# Patient Record
Sex: Male | Born: 1945 | Race: White | Hispanic: No | Marital: Single | Smoking: Former smoker
Health system: Southern US, Community
[De-identification: ages and names within clinical notes are randomized; demographics above are authoritative.]

## PROBLEM LIST (undated history)

## (undated) DIAGNOSIS — I219 Acute myocardial infarction, unspecified: Secondary | ICD-10-CM

## (undated) DIAGNOSIS — E785 Hyperlipidemia, unspecified: Secondary | ICD-10-CM

## (undated) DIAGNOSIS — I10 Essential (primary) hypertension: Secondary | ICD-10-CM

## (undated) DIAGNOSIS — Z955 Presence of coronary angioplasty implant and graft: Secondary | ICD-10-CM

---

## 2005-06-23 DIAGNOSIS — Z955 Presence of coronary angioplasty implant and graft: Secondary | ICD-10-CM

## 2005-06-23 HISTORY — DX: Presence of coronary angioplasty implant and graft: Z95.5

## 2005-06-23 HISTORY — PX: CORONARY ANGIOPLASTY WITH STENT PLACEMENT: SHX49

## 2008-06-23 DIAGNOSIS — I219 Acute myocardial infarction, unspecified: Secondary | ICD-10-CM

## 2008-06-23 HISTORY — PX: CORONARY ANGIOPLASTY WITH STENT PLACEMENT: SHX49

## 2008-06-23 HISTORY — DX: Acute myocardial infarction, unspecified: I21.9

## 2008-08-24 ENCOUNTER — Inpatient Hospital Stay (HOSPITAL_COMMUNITY): Admission: EM | Admit: 2008-08-24 | Discharge: 2008-08-26 | Payer: Self-pay | Admitting: *Deleted

## 2008-08-24 ENCOUNTER — Ambulatory Visit: Payer: Self-pay | Admitting: Cardiology

## 2009-05-04 ENCOUNTER — Ambulatory Visit: Payer: Self-pay

## 2010-10-03 LAB — CBC
HCT: 38.2 % — ABNORMAL LOW (ref 39.0–52.0)
HCT: 39.4 % (ref 39.0–52.0)
HCT: 39.5 % (ref 39.0–52.0)
HCT: 41.1 % (ref 39.0–52.0)
Hemoglobin: 13.3 g/dL (ref 13.0–17.0)
Hemoglobin: 13.9 g/dL (ref 13.0–17.0)
Hemoglobin: 14.4 g/dL (ref 13.0–17.0)
MCHC: 34.9 g/dL (ref 30.0–36.0)
MCHC: 35.1 g/dL (ref 30.0–36.0)
MCV: 100.5 fL — ABNORMAL HIGH (ref 78.0–100.0)
MCV: 101.3 fL — ABNORMAL HIGH (ref 78.0–100.0)
MCV: 102.1 fL — ABNORMAL HIGH (ref 78.0–100.0)
MCV: 102.2 fL — ABNORMAL HIGH (ref 78.0–100.0)
Platelets: 220 10*3/uL (ref 150–400)
Platelets: 223 10*3/uL (ref 150–400)
Platelets: 239 10*3/uL (ref 150–400)
Platelets: 257 10*3/uL (ref 150–400)
RBC: 4.09 MIL/uL — ABNORMAL LOW (ref 4.22–5.81)
RDW: 13.8 % (ref 11.5–15.5)
RDW: 13.8 % (ref 11.5–15.5)
RDW: 14 % (ref 11.5–15.5)
WBC: 10.6 10*3/uL — ABNORMAL HIGH (ref 4.0–10.5)
WBC: 14.1 10*3/uL — ABNORMAL HIGH (ref 4.0–10.5)

## 2010-10-03 LAB — BASIC METABOLIC PANEL
BUN: 11 mg/dL (ref 6–23)
BUN: 8 mg/dL (ref 6–23)
CO2: 24 mEq/L (ref 19–32)
Chloride: 108 mEq/L (ref 96–112)
Chloride: 112 mEq/L (ref 96–112)
Creatinine, Ser: 0.97 mg/dL (ref 0.4–1.5)
GFR calc non Af Amer: >60 mL/min (ref >60)
Glucose, Bld: 139 mg/dL — ABNORMAL HIGH (ref 70–99)
Glucose, Bld: 150 mg/dL — ABNORMAL HIGH (ref 70–99)
Potassium: 3.7 mEq/L (ref 3.5–5.1)
Potassium: 4.2 mEq/L (ref 3.5–5.1)
Sodium: 140 mEq/L (ref 135–145)

## 2010-10-03 LAB — DIFFERENTIAL
Eosinophils Absolute: 0.1 10*3/uL (ref 0.0–0.7)
Eosinophils Relative: 1 % (ref 0–5)
Lymphs Abs: 2.2 10*3/uL (ref 0.7–4.0)
Monocytes Absolute: 0.2 10*3/uL (ref 0.1–1.0)
Monocytes Relative: 2 % — ABNORMAL LOW (ref 3–12)

## 2010-10-03 LAB — POCT I-STAT 3, ART BLOOD GAS (G3+)
Bicarbonate: 19.9 mEq/L — ABNORMAL LOW (ref 20.0–24.0)
O2 Saturation: 100 %
TCO2: 21 mmol/L (ref 0–100)
pCO2 arterial: 38.4 mmHg (ref 35.0–45.0)

## 2010-10-03 LAB — POCT I-STAT, CHEM 8
BUN: 14 mg/dL (ref 6–23)
Calcium, Ion: 1.15 mmol/L (ref 1.12–1.32)
Chloride: 108 mEq/L (ref 96–112)
Creatinine, Ser: 0.9 mg/dL (ref 0.4–1.5)
TCO2: 20 mmol/L (ref 0–100)

## 2010-10-03 LAB — MAGNESIUM: Magnesium: 2.3 mg/dL (ref 1.5–2.5)

## 2010-10-03 LAB — GLUCOSE, CAPILLARY: Glucose-Capillary: 117 mg/dL — ABNORMAL HIGH (ref 70–99)

## 2010-10-03 LAB — CK TOTAL AND CKMB (NOT AT ARMC)
Relative Index: 11.2 — ABNORMAL HIGH (ref 0.0–2.5)
Total CK: 612 U/L — ABNORMAL HIGH (ref 7–232)

## 2010-10-03 LAB — LIPID PANEL
HDL: 41 mg/dL (ref >39)
Total CHOL/HDL Ratio: 3.9 RATIO
VLDL: 20 mg/dL (ref 0–40)

## 2010-10-03 LAB — PROTIME-INR
INR: 1 (ref 0.00–1.49)
Prothrombin Time: 13.5 seconds (ref 11.6–15.2)

## 2010-10-03 LAB — TROPONIN I: Troponin I: 4.43 ng/mL (ref 0.00–0.06)

## 2010-11-05 NOTE — H&P (Signed)
NAMEMARLOW, BERENGUER NO.:  [id number]   MEDICAL RECORD NO.:  [id number]          PATIENT TYPE:  INP   LOCATION:  2913                         FACILITY:  [facility]   PHYSICIAN:  Marca Ancona, MD      DATE OF BIRTH:  [birth year]   DATE OF ADMISSION:  08/24/2008  DATE OF DISCHARGE:                              HISTORY & PHYSICAL   PRIMARY CARDIOLOGIST:  In [place].   PRIMARY PHYSICIAN:  Lianne Bushy, MD in [place].   HISTORY OF PRESENT ILLNESS:  This is a 65 year old with a history of  coronary artery disease status post PCI 4 years ago at [facility] who  developed severe substernal chest pain 2-1/2 hours ago at home while at  rest.  The pain has been 10/10 substernal pressure and associated with  diaphoresis and the pain has been unrelenting.  The patient has had no  chest pain since his prior procedure.  He has good exercise tolerance,  and no chest pain or shortness of breath with exertion at baseline.   ALLERGIES:  None.   MEDICATIONS:  1. Coreg 3.125 mg b.i.d.  2. Enalapril 5 mg daily.  3. Aspirin 81 mg daily.  4. Pravastatin 40 mg daily.   PAST MEDICAL HISTORY:  1. Coronary artery disease.  The patient did have a stent placed 4      years ago at [facility], question which vessel  this was, he has      had no chest pain since.  2. Hypertension.  3. Hyperlipidemia.   Of note, the patient has no history of diabetes and no history of GI  bleed.   SOCIAL HISTORY:  The patient lives in [place] alone.  He works at [employer].  He quit smoking a year ago.  Drinks alcohol rarely, does  not use any illicit drugs.   FAMILY HISTORY:  Noncontributory.   REVIEW OF SYSTEMS:  Negative except as noted in the history of present  illness.   EKG is reviewed, shows sinus rhythm with an inferoposterior MI.  Labs  are pending.   PHYSICAL EXAMINATION:  VITAL SIGNS:  The patient is afebrile, pulse is  in the 50s, blood pressure 140/91, oxygen saturation 100%  on 2 liters  nasal cannula.  GENERAL:  This is a well-developed male in moderate distress.  HEENT:  Normal exam.  ABDOMEN:  Soft and nontender.  No hepatosplenomegaly.  NECK:  There is no JVD.  There is no thyromegaly or thyroid nodule.  CARDIOVASCULAR:  Heart regular.  S1 and S2.  There is a soft S4.  There  is no S3.  There is no murmur.  There are 2+ posterior tibial pulses  bilaterally.  There is no carotid bruit and there is no peripheral  edema.  EXTREMITIES:  No clubbing or cyanosis, slight bibasilar crackles.  MUSCULOSKELETAL:  Normal.  NEUROLOGIC:  Alert and oriented x3.  Normal affect.   ASSESSMENT AND PLAN:  This is a 65 year old with history of coronary  artery disease who presents with an inferoposterior myocardial  infarction.  Plan will be to  give him aspirin, start heparin drips, as  well as nitroglycerin drip.  His heart rate is in the 50s.  We will hold  off on Lopressor.  He will be transported to the cath lab for primary  percutaneous coronary intervention.      Marca Ancona, MD  Electronically Signed     DM/MEDQ  D:  08/24/2008  T:  08/24/2008  Job:  [id number]   cc:   Corky Crafts, MD  Lianne Bushy, M.D.

## 2010-11-08 NOTE — Discharge Summary (Signed)
[name], [name]:  [id number]   MEDICAL RECORD NO.:  [id number]          PATIENT TYPE:  INP   LOCATION:  2036                         FACILITY:  [facility]   PHYSICIAN:  Corky Crafts, MDDATE OF BIRTH:  03-Dec-1945   DATE OF ADMISSION:  08/24/2008  DATE OF DISCHARGE:  08/26/2008                               DISCHARGE SUMMARY   DISCHARGE DIAGNOSES:  1. Acute Inferior wall myocardial infarction.  2. Ischemic cardiomyopathy, ejection fraction 45%.  3. Former smoker, smoking cessation counseling.  4. Infrarenal aneurysm.  5. Dyslipidemia.  6. Hypertension.  7. Long-term medication use.   Kevin Randolph is a 65 year old male patient who has had a percutaneous  intervention about 4 years ago at [facility].  He developed substernal  chest pain about 2-1/2 hours prior to arrival at [facility], the pain  was at rest.  It was associated with diaphoresis and has been  unrelenting.   Upon arrival to the emergency room, the patient had significant ST-T  wave changes in the inferior leads consistent with STEMI of the inferior  wall.  Apparently, the RCA was occluded proximally just at the previous  stent site.  Dr. Eldridge Dace was able to use a fetch device with successful  aspiration of thrombus, a bare-metal stent was placed.  Dr. Eldridge Dace  felt the patient needed to remain on aspirin and Plavix for at least 30  days, but likely longer.  The patient did well and was ready for  discharge to home within 2 days.   DISCHARGE LABORATORIES:  Sodium 140, potassium 3.5, BUN 8, creatinine  0.99, hemoglobin 13.3, hematocrit 38.2, platelets 223, and white count  10.3.  Maximum CK 1521 and MB fraction of 282.4, and troponin 37.73.   DISCHARGE MEDICATIONS:  1. Enalapril 5 mg a day.  2. Coreg 3.125 mg a day.  3. Coated aspirin 375 mg a day.  4. Sublingual nitroglycerin p.r.n. chest pain.  5. Plavix 75 mg a day.  6. Zocor 20 mg a day.  Again, the patient is discharged  to home in stable, but improved  condition.  He is to remain on low-sodium, heart-  healthy diet.  Clean cath site gently with soap and water.  No  scrubbing.  Increase activity slowly as per cardiac rehabilitation.  No  lifting over 10 pounds for 1 week.  No driving for 1 week.  Follow up  with Dr. Eldridge Dace on September 08, 2008, at 11:45 a.m.  In addition, we  asked him to stop taking his pravastatin and to remain on Zocor.      Guy Franco, P.A.      Corky Crafts, MD  Electronically Signed    LB/MEDQ  D:  10/12/2008  T:  10/13/2008  Job:  [id number]   cc:   Lianne Bushy, M.D.  Corky Crafts, MD

## 2014-03-15 ENCOUNTER — Ambulatory Visit: Payer: Self-pay | Admitting: Internal Medicine

## 2016-07-20 ENCOUNTER — Emergency Department (HOSPITAL_COMMUNITY): Payer: Medicare Other

## 2016-07-20 ENCOUNTER — Encounter (HOSPITAL_COMMUNITY): Payer: Self-pay | Admitting: *Deleted

## 2016-07-20 ENCOUNTER — Inpatient Hospital Stay (HOSPITAL_COMMUNITY)
Admission: EM | Admit: 2016-07-20 | Discharge: 2016-07-22 | DRG: 287 | Disposition: A | Discharge status: Home / Self Care | Payer: Medicare Other | Attending: Internal Medicine | Admitting: Internal Medicine

## 2016-07-20 DIAGNOSIS — I25110 Atherosclerotic heart disease of native coronary artery with unstable angina pectoris: Secondary | ICD-10-CM | POA: Diagnosis present

## 2016-07-20 DIAGNOSIS — Y831 Surgical operation with implant of artificial internal device as the cause of abnormal reaction of the patient, or of later complication, without mention of misadventure at the time of the procedure: Secondary | ICD-10-CM | POA: Diagnosis present

## 2016-07-20 DIAGNOSIS — R079 Chest pain, unspecified: Secondary | ICD-10-CM | POA: Diagnosis present

## 2016-07-20 DIAGNOSIS — I252 Old myocardial infarction: Secondary | ICD-10-CM

## 2016-07-20 DIAGNOSIS — I110 Hypertensive heart disease with heart failure: Secondary | ICD-10-CM | POA: Diagnosis present

## 2016-07-20 DIAGNOSIS — Z7982 Long term (current) use of aspirin: Secondary | ICD-10-CM

## 2016-07-20 DIAGNOSIS — Z79899 Other long term (current) drug therapy: Secondary | ICD-10-CM

## 2016-07-20 DIAGNOSIS — Z9119 Patient's noncompliance with other medical treatment and regimen: Secondary | ICD-10-CM

## 2016-07-20 DIAGNOSIS — I509 Heart failure, unspecified: Secondary | ICD-10-CM | POA: Diagnosis present

## 2016-07-20 DIAGNOSIS — E785 Hyperlipidemia, unspecified: Secondary | ICD-10-CM | POA: Diagnosis present

## 2016-07-20 DIAGNOSIS — T82855A Stenosis of coronary artery stent, initial encounter: Principal | ICD-10-CM | POA: Diagnosis present

## 2016-07-20 DIAGNOSIS — Z955 Presence of coronary angioplasty implant and graft: Secondary | ICD-10-CM

## 2016-07-20 DIAGNOSIS — Z87891 Personal history of nicotine dependence: Secondary | ICD-10-CM

## 2016-07-20 DIAGNOSIS — I2510 Atherosclerotic heart disease of native coronary artery without angina pectoris: Secondary | ICD-10-CM | POA: Diagnosis present

## 2016-07-20 HISTORY — DX: Presence of coronary angioplasty implant and graft: Z95.5

## 2016-07-20 HISTORY — DX: Essential (primary) hypertension: I10

## 2016-07-20 HISTORY — DX: Hyperlipidemia, unspecified: E78.5

## 2016-07-20 HISTORY — DX: Acute myocardial infarction, unspecified: I21.9

## 2016-07-20 LAB — BASIC METABOLIC PANEL
Anion gap: 8 (ref 5–15)
BUN: 11 mg/dL (ref 6–20)
CALCIUM: 9 mg/dL (ref 8.9–10.3)
CHLORIDE: 112 mmol/L — AB (ref 101–111)
CO2: 21 mmol/L — AB (ref 22–32)
CREATININE: 0.66 mg/dL (ref 0.61–1.24)
GFR calc Af Amer: >60 mL/min (ref >60)
GFR calc non Af Amer: >60 mL/min (ref >60)
GLUCOSE: 88 mg/dL (ref 65–99)
Potassium: 4 mmol/L (ref 3.5–5.1)
Sodium: 141 mmol/L (ref 135–145)

## 2016-07-20 LAB — TROPONIN I

## 2016-07-20 LAB — CBC
HCT: 44.9 % (ref 39.0–52.0)
Hemoglobin: 15.7 g/dL (ref 13.0–17.0)
MCH: 33.3 pg (ref 26.0–34.0)
MCHC: 35 g/dL (ref 30.0–36.0)
MCV: 95.3 fL (ref 78.0–100.0)
PLATELETS: 212 10*3/uL (ref 150–400)
RBC: 4.71 MIL/uL (ref 4.22–5.81)
RDW: 13.1 % (ref 11.5–15.5)
WBC: 9.8 10*3/uL (ref 4.0–10.5)

## 2016-07-20 MED ORDER — HYDRALAZINE HCL 20 MG/ML IJ SOLN
10.0000 mg | Freq: Once | INTRAMUSCULAR | Status: DC
  Filled 2016-07-20: qty 1

## 2016-07-20 MED ORDER — NITROGLYCERIN 0.4 MG SL SUBL
0.4000 mg | SUBLINGUAL_TABLET | SUBLINGUAL | Status: DC | PRN
  Administered 2016-07-20: 0.4 mg via SUBLINGUAL
  Filled 2016-07-20: qty 1

## 2016-07-20 MED ORDER — MORPHINE SULFATE (PF) 4 MG/ML IV SOLN
4.0000 mg | INTRAVENOUS | Status: DC | PRN
  Administered 2016-07-20 – 2016-07-21 (×2): 4 mg via INTRAVENOUS
  Filled 2016-07-20 (×2): qty 1

## 2016-07-20 NOTE — ED Triage Notes (Signed)
The pt is c/o chest pain  For 2 weeks no sob no nausea no dizziness he reports that when he has chest pain he takes an aspirin but today that did not help

## 2016-07-20 NOTE — ED Provider Notes (Addendum)
[facility] DE[facility] Provider Note   CSN: [id number] Arrival date & time: 07/20/16  2124     History   Chief Complaint Chief Complaint  Patient presents with  . Chest Pain    HPI Kevin Randolph is a 71 y.o. male. His chief complaint is chest pain.  HPI: She has had intermittent chest pain over the last few weeks. Is similar to past episodes of angina and previous MI. Left chest rating to his left arm to the elbow. Mild associated shortness of breath. No nausea no syncope presyncope lightheadedness or other symptoms. History of an MI with right coronary stent done at [facility] in 2009. Had recurrent MI requiring repeat PTCI with additional right coronary artery stents in 2010. He follows with a cardiologist in [place].  Pain on arrival is 3-4/10  Past Medical History:  Diagnosis Date  . Coronary artery disease   . History of placement of stent in LAD coronary artery   . MI (myocardial infarction)     Patient Active Problem List   Diagnosis Date Noted  . Chest pain 07/20/2016    History reviewed. No pertinent surgical history.     Home Medications    Prior to Admission medications   Medication Sig Start Date End Date Taking? Authorizing Provider  aspirin EC 81 MG tablet Take 81 mg by mouth daily.   Yes Historical Provider, MD  atorvastatin (LIPITOR) 40 MG tablet Take 40 mg by mouth daily.   Yes Historical Provider, MD  carvedilol (COREG) 6.25 MG tablet Take 6.25 mg by mouth 2 (two) times daily with a meal.   Yes Historical Provider, MD  lisinopril (PRINIVIL,ZESTRIL) 10 MG tablet Take 10 mg by mouth daily.   Yes Historical Provider, MD    Family History No family history on file.  Social History Social History  Substance Use Topics  . Smoking status: Current Every Day Smoker  . Smokeless tobacco: Never Used  . Alcohol use No     Allergies   Patient has no known allergies.   Review of Systems Review of Systems  Constitutional: Negative for appetite  change, chills, diaphoresis, fatigue and fever.  HENT: Negative for mouth sores, sore throat and trouble swallowing.   Eyes: Negative for visual disturbance.  Respiratory: Negative for cough, chest tightness, shortness of breath and wheezing.   Cardiovascular: Positive for chest pain.  Gastrointestinal: Negative for abdominal distention, abdominal pain, diarrhea, nausea and vomiting.  Endocrine: Negative for polydipsia, polyphagia and polyuria.  Genitourinary: Negative for dysuria, frequency and hematuria.  Musculoskeletal: Negative for gait problem.  Skin: Negative for color change, pallor and rash.  Neurological: Negative for dizziness, syncope, light-headedness and headaches.  Hematological: Does not bruise/bleed easily.  Psychiatric/Behavioral: Negative for behavioral problems and confusion.     Physical Exam Updated Vital Signs BP 125/72   Pulse (!) 47   Temp 98.6 F (37 C) (Oral)   Resp 18   Ht 5\' 9"  (1.753 m)   Wt 145 lb (65.8 kg)   SpO2 96%   BMI 21.41 kg/m   Physical Exam  Constitutional: He is oriented to person, place, and time. He appears well-developed and well-nourished. No distress.  HENT:  Head: Normocephalic.  Eyes: Conjunctivae are normal. Pupils are equal, round, and reactive to light. No scleral icterus.  Neck: Normal range of motion. Neck supple. No thyromegaly present.  Cardiovascular: Normal rate and regular rhythm.  Exam reveals no gallop and no friction rub.   No murmur heard. Pulmonary/Chest: Effort  normal and breath sounds normal. No respiratory distress. He has no wheezes. He has no rales.  Abdominal: Soft. Bowel sounds are normal. He exhibits no distension. There is no tenderness. There is no rebound.  Musculoskeletal: Normal range of motion.  Neurological: He is alert and oriented to person, place, and time.  Skin: Skin is warm and dry. No rash noted.  Psychiatric: He has a normal mood and affect. His behavior is normal.     ED Treatments /  Results  Labs (all labs ordered are listed, but only abnormal results are displayed) Labs Reviewed  BASIC METABOLIC PANEL - Abnormal; Notable for the following:       Result Value   Chloride 112 (*)    CO2 21 (*)    All other components within normal limits  CBC  TROPONIN I    EKG  EKG Interpretation  Date/Time:  Sunday July 20 2016 23:29:51 EST Ventricular Rate:  47 PR Interval:  176 QRS Duration: 115 QT Interval:  449 QTC Calculation: 397 R Axis:   47 Text Interpretation:  Sinus bradycardia Nonspecific intraventricular conduction delay Inferior infarct, age indeterminate Consider anterolateral infarct Confirmed by Fayrene FearingJAMES  MD, Brindy Higginbotham ([id number]) on 07/20/2016 11:35:06 PM       Radiology Dg Chest 2 View  Result Date: 07/20/2016 CLINICAL DATA:  Chest pain 2 weeks. EXAM: CHEST  2 VIEW COMPARISON:  10/15/2014 FINDINGS: Lungs are adequately inflated without airspace consolidation or effusion. Small nodular density projected over the left base on the frontal film. Cardiomediastinal silhouette is within normal. There is calcified plaque over the aortic arch. There are mild degenerate changes of the spine. IMPRESSION: No acute cardiopulmonary disease. Small nodular density projected over the left base on the frontal film. Recommend follow-up chest radiograph 3 months. If there is persist, would recommend follow-up noncontrast chest CT. Electronically Signed   By: Elberta Fortisaniel  Boyle M.D.   On: 07/20/2016 23:00    Procedures Procedures (including critical care time)  Medications Ordered in ED Medications  morphine 4 MG/ML injection 4 mg (4 mg Intravenous Given 07/20/16 2212)  nitroGLYCERIN (NITROSTAT) SL tablet 0.4 mg (0.4 mg Sublingual Given 07/20/16 2205)  hydrALAZINE (APRESOLINE) injection 10 mg (0 mg Intravenous Hold 07/20/16 2204)     Initial Impression / Assessment and Plan / ED Course  I have reviewed the triage vital signs and the nursing notes.  Pertinent labs & imaging results  that were available during my care of the patient were reviewed by me and considered in my medical decision making (see chart for details).     KG shows inferior Q waves. No additional changes versus comparison. Given single nitroglycerin. He is hypertensive. Given hydralazine 10 as his  Heart rate was in the 50s. Recheck shows systolic BP 1:30. Heart rate 49. Symptom-free. Has had aspirin today. [facility]  The patient admitted for rule out.  Final Clinical Impressions(s) / ED Diagnoses   Final diagnoses:  Chest pain, unspecified type    New Prescriptions New Prescriptions   No medications on file     Rolland PorterMark Tyliek Timberman, MD 07/20/16 2346    Rolland PorterMark Davon Folta, MD 08/03/16 2329

## 2016-07-20 NOTE — ED Notes (Signed)
EDP at bedside  

## 2016-07-20 NOTE — ED Notes (Signed)
Pt back in room. No distress observed. 

## 2016-07-20 NOTE — ED Notes (Signed)
Patient transported to X-ray 

## 2016-07-21 ENCOUNTER — Encounter (HOSPITAL_COMMUNITY)
Admission: EM | Disposition: A | Discharge status: Home / Self Care | Payer: Self-pay | Source: Home / Self Care | Attending: Internal Medicine

## 2016-07-21 ENCOUNTER — Encounter (HOSPITAL_COMMUNITY): Payer: Self-pay | Admitting: Physician Assistant

## 2016-07-21 DIAGNOSIS — Z9119 Patient's noncompliance with other medical treatment and regimen: Secondary | ICD-10-CM | POA: Diagnosis not present

## 2016-07-21 DIAGNOSIS — E782 Mixed hyperlipidemia: Secondary | ICD-10-CM

## 2016-07-21 DIAGNOSIS — I200 Unstable angina: Secondary | ICD-10-CM

## 2016-07-21 DIAGNOSIS — E785 Hyperlipidemia, unspecified: Secondary | ICD-10-CM | POA: Diagnosis present

## 2016-07-21 DIAGNOSIS — I25110 Atherosclerotic heart disease of native coronary artery with unstable angina pectoris: Secondary | ICD-10-CM

## 2016-07-21 DIAGNOSIS — I2589 Other forms of chronic ischemic heart disease: Secondary | ICD-10-CM | POA: Diagnosis not present

## 2016-07-21 DIAGNOSIS — Z87891 Personal history of nicotine dependence: Secondary | ICD-10-CM | POA: Diagnosis not present

## 2016-07-21 DIAGNOSIS — Z955 Presence of coronary angioplasty implant and graft: Secondary | ICD-10-CM | POA: Diagnosis not present

## 2016-07-21 DIAGNOSIS — I952 Hypotension due to drugs: Secondary | ICD-10-CM

## 2016-07-21 DIAGNOSIS — R079 Chest pain, unspecified: Secondary | ICD-10-CM

## 2016-07-21 DIAGNOSIS — I110 Hypertensive heart disease with heart failure: Secondary | ICD-10-CM | POA: Diagnosis present

## 2016-07-21 DIAGNOSIS — T82855A Stenosis of coronary artery stent, initial encounter: Secondary | ICD-10-CM | POA: Diagnosis present

## 2016-07-21 DIAGNOSIS — Z79899 Other long term (current) drug therapy: Secondary | ICD-10-CM | POA: Diagnosis not present

## 2016-07-21 DIAGNOSIS — I2510 Atherosclerotic heart disease of native coronary artery without angina pectoris: Secondary | ICD-10-CM | POA: Diagnosis present

## 2016-07-21 DIAGNOSIS — Y831 Surgical operation with implant of artificial internal device as the cause of abnormal reaction of the patient, or of later complication, without mention of misadventure at the time of the procedure: Secondary | ICD-10-CM | POA: Diagnosis present

## 2016-07-21 DIAGNOSIS — I509 Heart failure, unspecified: Secondary | ICD-10-CM | POA: Diagnosis present

## 2016-07-21 DIAGNOSIS — I252 Old myocardial infarction: Secondary | ICD-10-CM | POA: Diagnosis not present

## 2016-07-21 DIAGNOSIS — Z7982 Long term (current) use of aspirin: Secondary | ICD-10-CM | POA: Diagnosis not present

## 2016-07-21 HISTORY — PX: CARDIAC CATHETERIZATION: SHX172

## 2016-07-21 LAB — PROTIME-INR
INR: 1.08
Prothrombin Time: 14.1 seconds (ref 11.4–15.2)

## 2016-07-21 LAB — TROPONIN I: Troponin I: <0.03 ng/mL (ref <0.03)

## 2016-07-21 SURGERY — LEFT HEART CATH AND CORONARY ANGIOGRAPHY

## 2016-07-21 MED ORDER — HEPARIN SODIUM (PORCINE) 1000 UNIT/ML IJ SOLN
INTRAMUSCULAR | Status: DC | PRN
  Administered 2016-07-21: 3500 [IU] via INTRAVENOUS

## 2016-07-21 MED ORDER — IOPAMIDOL (ISOVUE-370) INJECTION 76%
INTRAVENOUS | Status: DC | PRN
  Administered 2016-07-21: 45 mL via INTRA_ARTERIAL

## 2016-07-21 MED ORDER — ENOXAPARIN SODIUM 40 MG/0.4ML ~~LOC~~ SOLN
40.0000 mg | SUBCUTANEOUS | Status: DC
  Administered 2016-07-21: 40 mg via SUBCUTANEOUS
  Filled 2016-07-21: qty 0.4

## 2016-07-21 MED ORDER — SODIUM CHLORIDE 0.9% FLUSH: 3.0000 mL | INTRAVENOUS | Status: DC | PRN

## 2016-07-21 MED ORDER — FENTANYL CITRATE (PF) 100 MCG/2ML IJ SOLN
INTRAMUSCULAR | Status: DC | PRN
  Administered 2016-07-21: 50 ug via INTRAVENOUS

## 2016-07-21 MED ORDER — HEPARIN SODIUM (PORCINE) 1000 UNIT/ML IJ SOLN
INTRAMUSCULAR | Status: AC
  Filled 2016-07-21: qty 1

## 2016-07-21 MED ORDER — IOPAMIDOL (ISOVUE-370) INJECTION 76%
INTRAVENOUS | Status: AC
  Filled 2016-07-21: qty 100

## 2016-07-21 MED ORDER — ISOSORBIDE MONONITRATE ER 30 MG PO TB24
30.0000 mg | ORAL_TABLET | Freq: Every day | ORAL | Status: DC
  Administered 2016-07-21 – 2016-07-22 (×2): 30 mg via ORAL
  Filled 2016-07-21 (×2): qty 1

## 2016-07-21 MED ORDER — HEPARIN (PORCINE) IN NACL 100-0.45 UNIT/ML-% IJ SOLN
800.0000 [IU]/h | INTRAMUSCULAR | Status: DC
  Administered 2016-07-21: 800 [IU]/h via INTRAVENOUS
  Filled 2016-07-21: qty 250

## 2016-07-21 MED ORDER — CARVEDILOL 6.25 MG PO TABS
6.2500 mg | ORAL_TABLET | Freq: Two times a day (BID) | ORAL | Status: DC
  Administered 2016-07-21 – 2016-07-22 (×2): 6.25 mg via ORAL
  Filled 2016-07-21 (×3): qty 1

## 2016-07-21 MED ORDER — ACETAMINOPHEN 325 MG PO TABS: 650.0000 mg | ORAL_TABLET | ORAL | Status: DC | PRN

## 2016-07-21 MED ORDER — HEPARIN BOLUS VIA INFUSION
3000.0000 [IU] | Freq: Once | INTRAVENOUS | Status: AC
  Administered 2016-07-21: 3000 [IU] via INTRAVENOUS
  Filled 2016-07-21: qty 3000

## 2016-07-21 MED ORDER — VERAPAMIL HCL 2.5 MG/ML IV SOLN
INTRAVENOUS | Status: DC | PRN
  Administered 2016-07-21: 10 mL via INTRA_ARTERIAL

## 2016-07-21 MED ORDER — MIDAZOLAM HCL 2 MG/2ML IJ SOLN
INTRAMUSCULAR | Status: AC
  Filled 2016-07-21: qty 2

## 2016-07-21 MED ORDER — ATORVASTATIN CALCIUM 40 MG PO TABS
40.0000 mg | ORAL_TABLET | Freq: Every day | ORAL | Status: DC
  Administered 2016-07-21: 40 mg via ORAL
  Filled 2016-07-21: qty 1

## 2016-07-21 MED ORDER — HEPARIN (PORCINE) IN NACL 2-0.9 UNIT/ML-% IJ SOLN
INTRAMUSCULAR | Status: DC | PRN
  Administered 2016-07-21: 1000 mL

## 2016-07-21 MED ORDER — MIDAZOLAM HCL 2 MG/2ML IJ SOLN
INTRAMUSCULAR | Status: DC | PRN
  Administered 2016-07-21: 2 mg via INTRAVENOUS

## 2016-07-21 MED ORDER — LIDOCAINE HCL (PF) 1 % IJ SOLN
INTRAMUSCULAR | Status: DC | PRN
  Administered 2016-07-21: 1 mL via INTRADERMAL

## 2016-07-21 MED ORDER — HEPARIN (PORCINE) IN NACL 2-0.9 UNIT/ML-% IJ SOLN
INTRAMUSCULAR | Status: AC
  Filled 2016-07-21: qty 1000

## 2016-07-21 MED ORDER — VERAPAMIL HCL 2.5 MG/ML IV SOLN
INTRAVENOUS | Status: AC
  Filled 2016-07-21: qty 2

## 2016-07-21 MED ORDER — SODIUM CHLORIDE 0.9 % WEIGHT BASED INFUSION: 1.0000 mL/kg/h | INTRAVENOUS | Status: DC

## 2016-07-21 MED ORDER — SODIUM CHLORIDE 0.9 % IV SOLN
INTRAVENOUS | Status: AC
  Administered 2016-07-21: 19:00:00 via INTRAVENOUS

## 2016-07-21 MED ORDER — LISINOPRIL 10 MG PO TABS
10.0000 mg | ORAL_TABLET | Freq: Every day | ORAL | Status: DC
  Administered 2016-07-22: 10 mg via ORAL
  Filled 2016-07-21 (×2): qty 1

## 2016-07-21 MED ORDER — FENTANYL CITRATE (PF) 100 MCG/2ML IJ SOLN
INTRAMUSCULAR | Status: AC
  Filled 2016-07-21: qty 2

## 2016-07-21 MED ORDER — ENOXAPARIN SODIUM 40 MG/0.4ML ~~LOC~~ SOLN
40.0000 mg | SUBCUTANEOUS | Status: DC
  Filled 2016-07-21: qty 0.4

## 2016-07-21 MED ORDER — SODIUM CHLORIDE 0.9% FLUSH
3.0000 mL | Freq: Two times a day (BID) | INTRAVENOUS | Status: DC
  Administered 2016-07-22: 3 mL via INTRAVENOUS

## 2016-07-21 MED ORDER — ONDANSETRON HCL 4 MG/2ML IJ SOLN
4.0000 mg | Freq: Four times a day (QID) | INTRAMUSCULAR | Status: DC | PRN
Start: 2016-07-21 — End: 2016-07-22

## 2016-07-21 MED ORDER — SODIUM CHLORIDE 0.9 % IV SOLN: 250.0000 mL | INTRAVENOUS | Status: DC | PRN

## 2016-07-21 MED ORDER — ASPIRIN EC 81 MG PO TBEC
81.0000 mg | DELAYED_RELEASE_TABLET | Freq: Every day | ORAL | Status: DC
  Administered 2016-07-21 – 2016-07-22 (×2): 81 mg via ORAL
  Filled 2016-07-21 (×2): qty 1

## 2016-07-21 MED ORDER — ASPIRIN 81 MG PO CHEW: 81.0000 mg | CHEWABLE_TABLET | ORAL | Status: DC

## 2016-07-21 MED ORDER — SODIUM CHLORIDE 0.9% FLUSH: 3.0000 mL | Freq: Two times a day (BID) | INTRAVENOUS | Status: DC

## 2016-07-21 MED ORDER — LIDOCAINE HCL (PF) 1 % IJ SOLN
INTRAMUSCULAR | Status: AC
Start: 2016-07-21 — End: 2016-07-21
  Filled 2016-07-21: qty 30

## 2016-07-21 MED ORDER — SODIUM CHLORIDE 0.9 % WEIGHT BASED INFUSION: 3.0000 mL/kg/h | INTRAVENOUS | Status: DC

## 2016-07-21 SURGICAL SUPPLY — 9 items
CATH IMPULSE 5F ANG/FL3.5 (CATHETERS) ×3 IMPLANT
DEVICE RAD COMP TR BAND LRG (VASCULAR PRODUCTS) ×3 IMPLANT
GLIDESHEATH SLEND SS 6F .021 (SHEATH) ×3 IMPLANT
GUIDEWIRE INQWIRE 1.5J.035X260 (WIRE) ×1 IMPLANT
INQWIRE 1.5J .035X260CM (WIRE) ×3
KIT HEART LEFT (KITS) ×3 IMPLANT
PACK CARDIAC CATHETERIZATION (CUSTOM PROCEDURE TRAY) ×3 IMPLANT
TRANSDUCER W/STOPCOCK (MISCELLANEOUS) ×3 IMPLANT
TUBING CIL FLEX 10 FLL-RA (TUBING) ×3 IMPLANT

## 2016-07-21 NOTE — H&P (View-Only) (Signed)
CARDIOLOGY CONSULT NOTE   Patient ID: Kevin Randolph MRN: [id number] DOB/AGE: 1946-05-17 71 y.o.  Admit date: 07/20/2016  Primary Physician   No PCP Per Patient Primary Cardiologist   Dr Lucianne Muss [facility]) 07/15/2016 Reason for Consultation   Chest pain Requesting MD: Dr Benjamine Mola  YQM:VHQIO Kevin Randolph is a 71 y.o. year old male with a history of RCA stent x 2, S-CHF w/ EF 40-45%, HTN, HLD, non-compliance  01/23 office visit, CHF was at baseline, wt 145 lbs. Pt only taking ASA, advised to restart Coreg, lisinopril and Lipitor. Lasix recently increased  Since office visit, he was doing well. He developed chest pain 2 weeks ago, but would take ASA and it would get better.   01/27, he developed chest pain and L arm pain, his usual angina. He took ASA without relief. The pain reached a 7/10. He was not SOB, nauseated or sweaty. He did not have SL NTG. He came to the ER 01/28 pm and was given SL NTG x 1, and morphine. His pain was relieved.  It is currently a 0/10.   He has not been checking his weight, but does not believe he has gained any. He states he is compliant w/ rx since last office visit and rx was refilled. He quit tobacco about a week into January. No other problems or complaints.    Past Medical History:  Diagnosis Date  . HTN (hypertension)   . Hyperlipidemia LDL goal <70   . MI (myocardial infarction) 2010   ISR at prox RCA stent site, s/p BMS RCA  . Stented coronary artery 2007   RCA stent at [facility]     Past Surgical History:  Procedure Laterality Date  . CORONARY ANGIOPLASTY WITH STENT PLACEMENT  2007   RCA stent at [facility]  . CORONARY ANGIOPLASTY WITH STENT PLACEMENT  2010   BMS RCA at [facility] for ISR    No Known Allergies  I have reviewed the patient's current medications . aspirin EC  81 mg Oral Daily  . atorvastatin  40 mg Oral q1800  . carvedilol  6.25 mg Oral BID WC  . enoxaparin (LOVENOX) injection  40 mg Subcutaneous Q24H  .  hydrALAZINE  10 mg Intravenous Once  . lisinopril  10 mg Oral Daily    acetaminophen, morphine injection, nitroGLYCERIN, ondansetron (ZOFRAN) IV  Prior to Admission medications   Medication Sig Start Date End Date Taking? Authorizing Provider  aspirin EC 81 MG tablet Take 81 mg by mouth daily.   Yes Historical Provider, MD  atorvastatin (LIPITOR) 40 MG tablet Take 40 mg by mouth daily.   Yes Historical Provider, MD  carvedilol (COREG) 6.25 MG tablet Take 6.25 mg by mouth 2 (two) times daily with a meal.   Yes Historical Provider, MD  lisinopril (PRINIVIL,ZESTRIL) 10 MG tablet Take 10 mg by mouth daily.   Yes Historical Provider, MD     Social History   Social History  . Marital status: Single    Spouse name: N/A  . Number of children: N/A  . Years of education: N/A   Occupational History  . Retired    Social History Main Topics  . Smoking status: Former Smoker    Quit date: 06/30/2016  . Smokeless tobacco: Never Used  . Alcohol use No  . Drug use: Unknown  . Sexual activity: Not on file   Other Topics Concern  . Not on file   Social History Narrative  [place] in [place]    Family Status  Relation Status  . Mother Deceased  . Father Deceased  . Sister Alive  . Neg Hx    Family History  Problem Relation Age of Onset  . CAD Neg Hx      ROS:  Full 14 point review of systems complete and found to be negative unless listed above.  Physical Exam: Blood pressure 109/62, pulse (!) 46, temperature 98.2 F (36.8 C), temperature source Oral, resp. rate 16, height 5\' 9"  (1.753 m), weight 145 lb 4.8 oz (65.9 kg), SpO2 98 %.  General: Well developed, well nourished, male in no acute distress Head: Eyes PERRLA, No xanthomas.   Normocephalic and atraumatic, oropharynx without edema or exudate. Dentition: poor Lungs: few scattered rales Heart: HRRR S1 S2, no rub/gallop, no murmur. pulses are 2+ all 4 extrem.   Neck: No carotid bruits. No lymphadenopathy.  JVD not  elevated Abdomen: Bowel sounds present, abdomen soft and non-tender without masses or hernias noted. Msk:  No spine or cva tenderness. No weakness, no joint deformities or effusions. Extremities: No clubbing or cyanosis. No edema.  Neuro: Alert and oriented X 3. No focal deficits noted. Psych:  Good affect, responds appropriately Skin: No rashes or lesions noted.  Labs:   Lab Results  Component Value Date   WBC 9.8 07/20/2016   HGB 15.7 07/20/2016   HCT 44.9 07/20/2016   MCV 95.3 07/20/2016   PLT 212 07/20/2016     Recent Labs Lab 07/20/16 2144  NA 141  K 4.0  CL 112*  CO2 21*  BUN 11  CREATININE 0.66  CALCIUM 9.0  GLUCOSE 88    Recent Labs  07/20/16 2144 07/21/16 0029 07/21/16 0347  TROPONINI <0.03 <0.03 <0.03   Lab Results  Component Value Date   CHOL  08/24/2008    158        ATP III CLASSIFICATION:  <200     mg/dL   Desirable  [id number]  mg/dL   Borderline High  >=295>=240    mg/dL   High          HDL 41 08/24/2008   LDLCALC  08/24/2008    97        Total Cholesterol/HDL:CHD Risk Coronary Heart Disease Risk Table                     Men   Women  1/2 Average Risk   3.4   3.3  Average Risk       5.0   4.4  2 X Average Risk   9.6   7.1  3 X Average Risk  23.4   11.0        Use the calculated Patient Ratio above and the CHD Risk Table to determine the patient's CHD Risk.        ATP III CLASSIFICATION (LDL):  <100     mg/dL   Optimal  [id number]  mg/dL   Near or Above                    Optimal  130-159  mg/dL   Borderline  [id number]  mg/dL   High  >725>190     mg/dL   Very High   TRIG [id number] 08/24/2008   [facility]  Clinical Diagnosesand EchocardiographicFindings Left ventricularhypertrophy Contractileleft ventriculardysfunction(moderate) Segmentalcontractile leftventricular dysfunction(see detailbelow) Inferiormyocardial infarction Decreasedleft ventricularejection  fraction(40-45%) Diastolicleft ventriculardysfunction Degenerativemitral valvedisease Dilatedleft atrium Aorticsclerosis Normalright ventricularcontractile performance Descriptive  Comments- Left Ventricle The leftventricle is normalin size withmildly increasedwall thickness;there is inferiorand apicalwall akinesis aswell as ventriculardyssynchronywith otherwisemoderately decreased contraction. The visuallyestimatedleft ventricularejection fractionis 40-45%. Diastolictransmitral flowprofile and mitralannular tissueDoppler signalcharacteristic ofimpaired left ventricularrelaxation. Mitral Valve The mitralvalve is mildlythickened withequivocal leafletprolapse. Thereis trivial mitralregurgitation bycolor flow andcontinuous wave Dopplerexaminations. Left Atrium The leftatrium is mildlydilated. Aortic Valve Suboptimallyimaged aorticvalve is mildlythickened andprobably trileafletwith well-preservedexcursion. Thereis no echocardiographicevidence ofaortic regurgitationby colorflow or continuouswave Doppler examinations. Thereis no evidence ofhemodynamicallyimportant aortictransvalvular gradient;the maximal instantaneousleftventricular outflowvelocity is 1.41m/s. Aorta The thoracicaorta is normalin diameterat the level ofthe left ventricularoutflow tract,sinuses of Valsalva,sinotubularjunction and transversearch. Pulmonary Artery The pulmonaryartery issuboptimally imaged. Pulmonic Valve The pulmonaryvalve issuboptimally imaged. Thereis no detectablepulmonary regurgitationby colorflow Dopplerimaging. The Dopplersignal is ofsuboptimal technicalquality; theright ventricularoutflow tractgradient  cannotbe precisely estimatedfrom this examinationbut peakjet velocity isprobably in therange of 0.62m/s. Right Ventricle Thereis normal right ventricularchambersize, wall thicknessand contraction. Tricuspid Valve The tricuspidvalve isstructurally normal. Thereis trivial tricuspidregurgitationby color flow andcontinuous wave Dopplerimaging. The signalis technicallysuboptimal andright ventricularsystolic pressurecannot be preciselyestimated fromthis examinationbut peak jet velocityis probablyin the range of2.0 m/s consistentwith normalrange(20-25 mmHg). Right Atrium The rightatrium is normalin size. Inferior Vena Cava The inferiorvena cavais normal in sizewith physiologicalphasic respiratorychange characteristicof normalcentral venousand right atrialpressures; the estimatedcentralvenous and rightatrial pressuresare 5-10 mm Hg. Pericardium Thereis no evidence ofpericardial effusion.  ECG:  01/28 SR, inferior and far lateral T wave abnormalities, no old  Cath: 08/24/2008 RCA was occluded proximally just at the previous  stent site.  Dr. Eldridge Dace was able to use a fetch device with successful  aspiration of thrombus, a bare-metal stent was placed.  Dr. Eldridge Dace  felt the patient needed to remain on aspirin and Plavix for at least 30  days, but likely longer.  The patient did well   Radiology:  Dg Chest 2 View  Result Date: 07/20/2016 CLINICAL DATA:  Chest pain 2 weeks. EXAM: CHEST  2 VIEW COMPARISON:  10/15/2014 FINDINGS: Lungs are adequately inflated without airspace consolidation or effusion. Small nodular density projected over the left base on the frontal film. Cardiomediastinal silhouette is within normal. There is calcified plaque over the aortic arch. There are mild degenerate changes of the spine. IMPRESSION: No acute  cardiopulmonary disease. Small nodular density projected over the left base on the frontal film. Recommend follow-up chest radiograph 3 months. If there is persist, would recommend follow-up noncontrast chest CT. Electronically Signed   By: Elberta Fortis M.D.   On: 07/20/2016 23:00    ASSESSMENT AND PLAN:   The patient was seen today by Dr Delton See, the patient evaluated and the data reviewed.  Principal Problem:   Chest pain with high risk for cardiac etiology - ez are negative for MI. - His pain resolved w/ SL NTG and morphine - his ECG is abnormal, chronicity unclear. - he is NPO, MD advise on cath vs MV. Leaning toward cath because of history of CRFs being poorly controlled. - no ischemic assessment since last cath.  Active Problems:   CAD (coronary artery disease), native coronary artery - see above  Signed: Leanna Battles 07/21/2016 7:53 AM Beeper [phone]  Co-Sign MD  The patient was seen, examined and discussed with Theodore Demark, PA-C and I agree with the above.    71 year old male with h/o known CAD, s/p PCI/stents to RCA x 2 (2007, 2010), no compliant with meds, now experiencing unstable angina with progressively worsening DOE and chest pain radiating to his left arm x 2 weeks. Negative troponin x3, ECG shows significant negative T  waves in the inferolateral leads. Chest pain improved with ASA , NTG and morphine. We will schedule for a left cardiac cath, Crea 0.66.   Tobias Alexander, MD 07/21/2016

## 2016-07-21 NOTE — Interval H&P Note (Signed)
History and Physical Interval Note:  07/21/2016 3:49 PM  Kevin Randolph  has presented today for cardiac catheterization, with the diagnosis of unstable angina. The various methods of treatment have been discussed with the patient and family. After consideration of risks, benefits and other options for treatment, the patient has consented to  Procedure(s): Left Heart Cath and Coronary Angiography (N/A) as a surgical intervention .  The patient's history has been reviewed, patient examined, no change in status, stable for surgery.  I have reviewed the patient's chart and labs.  Questions were answered to the patient's satisfaction.    Cath Lab Visit (complete for each Cath Lab visit)  Clinical Evaluation Leading to the Procedure:   ACS: Yes.    Non-ACS:  N/A   Evon Lopezperez

## 2016-07-21 NOTE — Progress Notes (Signed)
ANTICOAGULATION CONSULT NOTE - Initial Consult  Pharmacy Consult for Heparin Indication: chest pain/ACS  No Known Allergies  Patient Measurements: Height: 5\' 9"  (175.3 cm) Weight: 145 lb 4.8 oz (65.9 kg) IBW/kg (Calculated) : 70.7 Heparin Dosing Weight: 65.9 kg  Vital Signs: Temp: 98.2 F (36.8 C) (01/29 0738) Temp Source: Oral (01/29 0738) BP: 109/62 (01/29 0500) Pulse Rate: 46 (01/29 0738)  Labs:  Recent Labs  07/20/16 2144 07/21/16 0029 07/21/16 0347 07/21/16 0639  HGB 15.7  --   --   --   HCT 44.9  --   --   --   PLT 212  --   --   --   CREATININE 0.66  --   --   --   TROPONINI <0.03 <0.03 <0.03 <0.03    Estimated Creatinine Clearance: 82.4 mL/min (by C-G formula based on SCr of 0.66 mg/dL).   Medical History: Past Medical History:  Diagnosis Date  . HTN (hypertension)   . Hyperlipidemia LDL goal <70   . MI (myocardial infarction) 2010   ISR at prox RCA stent site, s/p BMS RCA  . Stented coronary artery 2007   [facility]   Assessment: 71 year old male with prior CAD and stents now with chest pain to start IV heparin for plan for CATH.   CBC wnl. Troponins negative. SCr 0.66/est CrCl ~7380mL/mn. Last dose of Lovenox this AM at 0911 AM.  Goal of Therapy:  Heparin level 0.3-0.7 units/ml Monitor platelets by anticoagulation protocol: Yes   Plan:  Give 3000 units bolus x 1 Start heparin infusion at 800 units/hr Check anti-Xa level in 6 hours and daily while on heparin Continue to monitor H&H and platelets  Follow-up post CATH  Link SnufferJessica Deriona Altemose, PharmD, BCPS Clinical Pharmacist Clinical phone 07/21/2016 until 3:30PM - [phone] After hours, please call #[phone] 07/21/2016,9:34 AM

## 2016-07-21 NOTE — Consult Note (Signed)
CARDIOLOGY CONSULT NOTE   Patient ID: Kevin Randolph MRN: [id number] DOB/AGE: 1946-05-17 71 y.o.  Admit date: 07/20/2016  Primary Physician   No PCP Per Patient Primary Cardiologist   Dr Lucianne Muss [facility]) 07/15/2016 Reason for Consultation   Chest pain Requesting MD: Dr Benjamine Mola  Kevin Randolph is a 71 y.o. year old male with a history of RCA stent x 2, S-CHF w/ EF 40-45%, HTN, HLD, non-compliance  01/23 office visit, CHF was at baseline, wt 145 lbs. Pt only taking ASA, advised to restart Coreg, lisinopril and Lipitor. Lasix recently increased  Since office visit, he was doing well. He developed chest pain 2 weeks ago, but would take ASA and it would get better.   01/27, he developed chest pain and L arm pain, his usual angina. He took ASA without relief. The pain reached a 7/10. He was not SOB, nauseated or sweaty. He did not have SL NTG. He came to the ER 01/28 pm and was given SL NTG x 1, and morphine. His pain was relieved.  It is currently a 0/10.   He has not been checking his weight, but does not believe he has gained any. He states he is compliant w/ rx since last office visit and rx was refilled. He quit tobacco about a week into January. No other problems or complaints.    Past Medical History:  Diagnosis Date  . HTN (hypertension)   . Hyperlipidemia LDL goal <70   . MI (myocardial infarction) 2010   ISR at prox RCA stent site, s/p BMS RCA  . Stented coronary artery 2007   RCA stent at [facility]     Past Surgical History:  Procedure Laterality Date  . CORONARY ANGIOPLASTY WITH STENT PLACEMENT  2007   RCA stent at [facility]  . CORONARY ANGIOPLASTY WITH STENT PLACEMENT  2010   BMS RCA at [facility] for ISR    No Known Allergies  I have reviewed the patient's current medications . aspirin EC  81 mg Oral Daily  . atorvastatin  40 mg Oral q1800  . carvedilol  6.25 mg Oral BID WC  . enoxaparin (LOVENOX) injection  40 mg Subcutaneous Q24H  .  hydrALAZINE  10 mg Intravenous Once  . lisinopril  10 mg Oral Daily    acetaminophen, morphine injection, nitroGLYCERIN, ondansetron (ZOFRAN) IV  Prior to Admission medications   Medication Sig Start Date End Date Taking? Authorizing Provider  aspirin EC 81 MG tablet Take 81 mg by mouth daily.   Yes Historical Provider, MD  atorvastatin (LIPITOR) 40 MG tablet Take 40 mg by mouth daily.   Yes Historical Provider, MD  carvedilol (COREG) 6.25 MG tablet Take 6.25 mg by mouth 2 (two) times daily with a meal.   Yes Historical Provider, MD  lisinopril (PRINIVIL,ZESTRIL) 10 MG tablet Take 10 mg by mouth daily.   Yes Historical Provider, MD     Social History   Social History  . Marital status: Single    Spouse name: N/A  . Number of children: N/A  . Years of education: N/A   Occupational History  . Retired    Social History Main Topics  . Smoking status: Former Smoker    Quit date: 06/30/2016  . Smokeless tobacco: Never Used  . Alcohol use No  . Drug use: Unknown  . Sexual activity: Not on file   Other Topics Concern  . Not on file   Social History Narrative  [place] in [place], [place]    Family Status  Relation Status  . Mother Deceased  . Father Deceased  . Sister Alive  . Neg Hx    Family History  Problem Relation Age of Onset  . CAD Neg Hx      ROS:  Full 14 point review of systems complete and found to be negative unless listed above.  Physical Exam: Blood pressure 109/62, pulse (!) 46, temperature 98.2 F (36.8 C), temperature source Oral, resp. rate 16, height 5\' 9"  (1.753 m), weight 145 lb 4.8 oz (65.9 kg), SpO2 98 %.  General: Well developed, well nourished, male in no acute distress Head: Eyes PERRLA, No xanthomas.   Normocephalic and atraumatic, oropharynx without edema or exudate. Dentition: poor Lungs: few scattered rales Heart: HRRR S1 S2, no rub/gallop, no murmur. pulses are 2+ all 4 extrem.   Neck: No carotid bruits. No lymphadenopathy.  JVD not  elevated Abdomen: Bowel sounds present, abdomen soft and non-tender without masses or hernias noted. Msk:  No spine or cva tenderness. No weakness, no joint deformities or effusions. Extremities: No clubbing or cyanosis. No edema.  Neuro: Alert and oriented X 3. No focal deficits noted. Psych:  Good affect, responds appropriately Skin: No rashes or lesions noted.  Labs:   Lab Results  Component Value Date   WBC 9.8 07/20/2016   HGB 15.7 07/20/2016   HCT 44.9 07/20/2016   MCV 95.3 07/20/2016   PLT 212 07/20/2016     Recent Labs Lab 07/20/16 2144  NA 141  K 4.0  CL 112*  CO2 21*  BUN 11  CREATININE 0.66  CALCIUM 9.0  GLUCOSE 88    Recent Labs  07/20/16 2144 07/21/16 0029 07/21/16 0347  TROPONINI <0.03 <0.03 <0.03   Lab Results  Component Value Date   CHOL  08/24/2008    158        ATP III CLASSIFICATION:  <200     mg/dL   Desirable  [id number]  mg/dL   Borderline High  >=295>=240    mg/dL   High          HDL 41 08/24/2008   LDLCALC  08/24/2008    97        Total Cholesterol/HDL:CHD Risk Coronary Heart Disease Risk Table                     Men   Women  1/2 Average Risk   3.4   3.3  Average Risk       5.0   4.4  2 X Average Risk   9.6   7.1  3 X Average Risk  23.4   11.0        Use the calculated Patient Ratio above and the CHD Risk Table to determine the patient's CHD Risk.        ATP III CLASSIFICATION (LDL):  <100     mg/dL   Optimal  [id number]  mg/dL   Near or Above                    Optimal  130-159  mg/dL   Borderline  [id number]  mg/dL   High  >725>190     mg/dL   Very High   TRIG [id number] 08/24/2008   [facility]  Clinical Diagnosesand EchocardiographicFindings Left ventricularhypertrophy Contractileleft ventriculardysfunction(moderate) Segmentalcontractile leftventricular dysfunction(see detailbelow) Inferiormyocardial infarction Decreasedleft ventricularejection  fraction(40-45%) Diastolicleft ventriculardysfunction Degenerativemitral valvedisease Dilatedleft atrium Aorticsclerosis Normalright ventricularcontractile performance Descriptive  Comments- Left Ventricle The leftventricle is normalin size withmildly increasedwall thickness;there is inferiorand apicalwall akinesis aswell as ventriculardyssynchronywith otherwisemoderately decreased contraction. The visuallyestimatedleft ventricularejection fractionis 40-45%. Diastolictransmitral flowprofile and mitralannular tissueDoppler signalcharacteristic ofimpaired left ventricularrelaxation. Mitral Valve The mitralvalve is mildlythickened withequivocal leafletprolapse. Thereis trivial mitralregurgitation bycolor flow andcontinuous wave Dopplerexaminations. Left Atrium The leftatrium is mildlydilated. Aortic Valve Suboptimallyimaged aorticvalve is mildlythickened andprobably trileafletwith well-preservedexcursion. Thereis no echocardiographicevidence ofaortic regurgitationby colorflow or continuouswave Doppler examinations. Thereis no evidence ofhemodynamicallyimportant aortictransvalvular gradient;the maximal instantaneousleftventricular outflowvelocity is 1.41m/s. Aorta The thoracicaorta is normalin diameterat the level ofthe left ventricularoutflow tract,sinuses of Valsalva,sinotubularjunction and transversearch. Pulmonary Artery The pulmonaryartery issuboptimally imaged. Pulmonic Valve The pulmonaryvalve issuboptimally imaged. Thereis no detectablepulmonary regurgitationby colorflow Dopplerimaging. The Dopplersignal is ofsuboptimal technicalquality; theright ventricularoutflow tractgradient  cannotbe precisely estimatedfrom this examinationbut peakjet velocity isprobably in therange of 0.62m/s. Right Ventricle Thereis normal right ventricularchambersize, wall thicknessand contraction. Tricuspid Valve The tricuspidvalve isstructurally normal. Thereis trivial tricuspidregurgitationby color flow andcontinuous wave Dopplerimaging. The signalis technicallysuboptimal andright ventricularsystolic pressurecannot be preciselyestimated fromthis examinationbut peak jet velocityis probablyin the range of2.0 m/s consistentwith normalrange(20-25 mmHg). Right Atrium The rightatrium is normalin size. Inferior Vena Cava The inferiorvena cavais normal in sizewith physiologicalphasic respiratorychange characteristicof normalcentral venousand right atrialpressures; the estimatedcentralvenous and rightatrial pressuresare 5-10 mm Hg. Pericardium Thereis no evidence ofpericardial effusion.  ECG:  01/28 SR, inferior and far lateral T wave abnormalities, no old  Cath: 08/24/2008 RCA was occluded proximally just at the previous  stent site.  Dr. Eldridge Dace was able to use a fetch device with successful  aspiration of thrombus, a bare-metal stent was placed.  Dr. Eldridge Dace  felt the patient needed to remain on aspirin and Plavix for at least 30  days, but likely longer.  The patient did well   Radiology:  Dg Chest 2 View  Result Date: 07/20/2016 CLINICAL DATA:  Chest pain 2 weeks. EXAM: CHEST  2 VIEW COMPARISON:  10/15/2014 FINDINGS: Lungs are adequately inflated without airspace consolidation or effusion. Small nodular density projected over the left base on the frontal film. Cardiomediastinal silhouette is within normal. There is calcified plaque over the aortic arch. There are mild degenerate changes of the spine. IMPRESSION: No acute  cardiopulmonary disease. Small nodular density projected over the left base on the frontal film. Recommend follow-up chest radiograph 3 months. If there is persist, would recommend follow-up noncontrast chest CT. Electronically Signed   By: Elberta Fortis M.D.   On: 07/20/2016 23:00    ASSESSMENT AND PLAN:   The patient was seen today by Dr Delton See, the patient evaluated and the data reviewed.  Principal Problem:   Chest pain with high risk for cardiac etiology - ez are negative for MI. - His pain resolved w/ SL NTG and morphine - his ECG is abnormal, chronicity unclear. - he is NPO, MD advise on cath vs MV. Leaning toward cath because of history of CRFs being poorly controlled. - no ischemic assessment since last cath.  Active Problems:   CAD (coronary artery disease), native coronary artery - see above  Signed: Leanna Battles 07/21/2016 7:53 AM Beeper [phone]  Co-Sign MD  The patient was seen, examined and discussed with Theodore Demark, PA-C and I agree with the above.    71 year old male with h/o known CAD, s/p PCI/stents to RCA x 2 (2007, 2010), no compliant with meds, now experiencing unstable angina with progressively worsening DOE and chest pain radiating to his left arm x 2 weeks. Negative troponin x3, ECG shows significant negative T  waves in the inferolateral leads. Chest pain improved with ASA , NTG and morphine. We will schedule for a left cardiac cath, Crea 0.66.   Tobias Alexander, MD 07/21/2016

## 2016-07-21 NOTE — H&P (Signed)
History and Physical    Kevin DrossLarry M Randolph ZOX:[id number] DOB: 03/15/1946 DOA: 07/20/2016   PCP: No PCP Per Patient Chief Complaint:  Chief Complaint  Patient presents with  . Chest Pain    HPI: Kevin Randolph is a 71 y.o. male with medical history significant of CAD: [facility], then had STEMI in March 2010 with ISR of the proximal RCA stent, treated with thrombectomy and bare metal stent placement at that time.  Patient presents to ED with c/o chest pain.  Chest pain onset over the last few weeks, intermittent.  Is similar to prior episodes of angina and previous MI.  Radiates to L arm and elbow.  Mild associated SOB.  No nausea, syncope, or other symptoms.  ED Course: CP resolved at the moment post ASA / NTG.  Review of Systems: As per HPI otherwise 10 point review of systems negative.    Past Medical History:  Diagnosis Date  . Coronary artery disease   . History of placement of stent in LAD coronary artery   . MI (myocardial infarction)     History reviewed. No pertinent surgical history.   reports that he has been smoking.  He has never used smokeless tobacco. He reports that he does not drink alcohol. His drug history is not on file.  No Known Allergies  No family history on file.   No fhx of early onset CAD   Prior to Admission medications   Medication Sig Start Date End Date Taking? Authorizing Provider  aspirin EC 81 MG tablet Take 81 mg by mouth daily.   Yes Historical Provider, MD  atorvastatin (LIPITOR) 40 MG tablet Take 40 mg by mouth daily.   Yes Historical Provider, MD  carvedilol (COREG) 6.25 MG tablet Take 6.25 mg by mouth 2 (two) times daily with a meal.   Yes Historical Provider, MD  lisinopril (PRINIVIL,ZESTRIL) 10 MG tablet Take 10 mg by mouth daily.   Yes Historical Provider, MD    Physical Exam: Vitals:   07/20/16 2215 07/20/16 2230 07/20/16 2315 07/21/16 0015  BP: 142/89 127/88 125/72 168/83  Pulse: 63 (!) 57 (!) 47 (!) 48    Resp: 19 14 18 18   Temp:      TempSrc:      SpO2: 96% 98% 96% 98%  Weight:      Height:          Constitutional: NAD, calm, comfortable Eyes: PERRL, lids and conjunctivae normal ENMT: Mucous membranes are moist. Posterior pharynx clear of any exudate or lesions.Normal dentition.  Neck: normal, supple, no masses, no thyromegaly Respiratory: clear to auscultation bilaterally, no wheezing, no crackles. Normal respiratory effort. No accessory muscle use.  Cardiovascular: Regular rate and rhythm, no murmurs / rubs / gallops. No extremity edema. 2+ pedal pulses. No carotid bruits.  Abdomen: no tenderness, no masses palpated. No hepatosplenomegaly. Bowel sounds positive.  Musculoskeletal: no clubbing / cyanosis. No joint deformity upper and lower extremities. Good ROM, no contractures. Normal muscle tone.  Skin: no rashes, lesions, ulcers. No induration Neurologic: CN 2-12 grossly intact. Sensation intact, DTR normal. Strength 5/5 in all 4.  Psychiatric: Normal judgment and insight. Alert and oriented x 3. Normal mood.    Labs on Admission: I have personally reviewed following labs and imaging studies  CBC:  Recent Labs Lab 07/20/16 2144  WBC 9.8  HGB 15.7  HCT 44.9  MCV 95.3  PLT 212   Basic Metabolic Panel:  Recent Labs Lab 07/20/16  2144  NA 141  K 4.0  CL 112*  CO2 21*  GLUCOSE 88  BUN 11  CREATININE 0.66  CALCIUM 9.0   GFR: Estimated Creatinine Clearance: 82.3 mL/min (by C-G formula based on SCr of 0.66 mg/dL). Liver Function Tests: No results for input(s): AST, ALT, ALKPHOS, BILITOT, PROT, ALBUMIN in the last 168 hours. No results for input(s): LIPASE, AMYLASE in the last 168 hours. No results for input(s): AMMONIA in the last 168 hours. Coagulation Profile: No results for input(s): INR, PROTIME in the last 168 hours. Cardiac Enzymes:  Recent Labs Lab 07/20/16 2144  TROPONINI <0.03   BNP (last 3 results) No results for input(s): PROBNP in the last  8760 hours. HbA1C: No results for input(s): HGBA1C in the last 72 hours. CBG: No results for input(s): GLUCAP in the last 168 hours. Lipid Profile: No results for input(s): CHOL, HDL, LDLCALC, TRIG, CHOLHDL, LDLDIRECT in the last 72 hours. Thyroid Function Tests: No results for input(s): TSH, T4TOTAL, FREET4, T3FREE, THYROIDAB in the last 72 hours. Anemia Panel: No results for input(s): VITAMINB12, FOLATE, FERRITIN, TIBC, IRON, RETICCTPCT in the last 72 hours. Urine analysis: No results found for: COLORURINE, APPEARANCEUR, LABSPEC, PHURINE, GLUCOSEU, HGBUR, BILIRUBINUR, KETONESUR, PROTEINUR, UROBILINOGEN, NITRITE, LEUKOCYTESUR Sepsis Labs: @LABRCNTIP (procalcitonin:4,lacticidven:4) )No results found for this or any previous visit (from the past 240 hour(s)).   Radiological Exams on Admission: Dg Chest 2 View  Result Date: 07/20/2016 CLINICAL DATA:  Chest pain 2 weeks. EXAM: CHEST  2 VIEW COMPARISON:  10/15/2014 FINDINGS: Lungs are adequately inflated without airspace consolidation or effusion. Small nodular density projected over the left base on the frontal film. Cardiomediastinal silhouette is within normal. There is calcified plaque over the aortic arch. There are mild degenerate changes of the spine. IMPRESSION: No acute cardiopulmonary disease. Small nodular density projected over the left base on the frontal film. Recommend follow-up chest radiograph 3 months. If there is persist, would recommend follow-up noncontrast chest CT. Electronically Signed   By: Elberta Fortis M.D.   On: 07/20/2016 23:00    EKG: Independently reviewed.  Assessment/Plan Principal Problem:   Chest pain with high risk for cardiac etiology Active Problems:   CAD (coronary artery disease), native coronary artery    1. CP - HEART score is 7 1. EKG shows new lateral ST segment depressions that were not present on discharge back in 2010.  Inferior Q waves are from the 2010 STEMI and were present on  discharge. 2. Trop neg thus far 3. CP obs pathway 4. Tele monitor 5. Serial trops 6. NPO 7. Call cards in AM re: stress test vs heart cath as next step   DVT prophylaxis: Lovenox Code Status: Full Family Communication: Daughter at bedside Consults called: None Admission status: Admit to obs   Hillary Bow DO [facility] Pager [phone] from 7PM-7AM  If 7AM-7PM, please contact the day physician for the patient [email or web address] Password [email or web address]  07/21/2016, 12:30 AM

## 2016-07-21 NOTE — Care Management Obs Status (Signed)
MEDICARE OBSERVATION STATUS NOTIFICATION   Patient Details  Name: Kevin Randolph MRN: [id number] Date of Birth: 08/01/1945   Medicare Observation Status Notification Given:  Yes    Gala LewandowskyGraves-Bigelow, Dayne Dekay Kaye, RN 07/21/2016, 6:18 PM

## 2016-07-21 NOTE — Progress Notes (Signed)
Patient admitted after midnight, please see H&P.  For cath today-- appreciate cardiology consult  Marlin CanaryJessica Madigan Rosensteel DO

## 2016-07-22 ENCOUNTER — Inpatient Hospital Stay (HOSPITAL_COMMUNITY): Payer: Medicare Other

## 2016-07-22 ENCOUNTER — Encounter (HOSPITAL_COMMUNITY): Payer: Self-pay | Admitting: Internal Medicine

## 2016-07-22 DIAGNOSIS — I2589 Other forms of chronic ischemic heart disease: Secondary | ICD-10-CM

## 2016-07-22 LAB — CBC
HCT: 38.6 % — ABNORMAL LOW (ref 39.0–52.0)
Hemoglobin: 13.1 g/dL (ref 13.0–17.0)
MCH: 32.7 pg (ref 26.0–34.0)
MCHC: 33.9 g/dL (ref 30.0–36.0)
MCV: 96.3 fL (ref 78.0–100.0)
PLATELETS: 169 10*3/uL (ref 150–400)
RBC: 4.01 MIL/uL — AB (ref 4.22–5.81)
RDW: 13.4 % (ref 11.5–15.5)
WBC: 8.3 10*3/uL (ref 4.0–10.5)

## 2016-07-22 LAB — ECHOCARDIOGRAM COMPLETE
Height: 69 in
WEIGHTICAEL: 2337.6 [oz_av]

## 2016-07-22 MED ORDER — ISOSORBIDE MONONITRATE ER 30 MG PO TB24: 30.0000 mg | ORAL_TABLET | Freq: Every day | ORAL | 0 refills | Status: AC

## 2016-07-22 NOTE — Discharge Instructions (Signed)
Radial Site Care °Introduction °Refer to this sheet in the next few weeks. These instructions provide you with information about caring for yourself after your procedure. Your health care provider may also give you more specific instructions. Your treatment has been planned according to current medical practices, but problems sometimes occur. Call your health care provider if you have any problems or questions after your procedure. °What can I expect after the procedure? °After your procedure, it is typical to have the following: °· Bruising at the radial site that usually fades within 1-2 weeks. °· Blood collecting in the tissue (hematoma) that may be painful to the touch. It should usually decrease in size and tenderness within 1-2 weeks. °Follow these instructions at home: °· Take medicines only as directed by your health care provider. °· You may shower 24-48 hours after the procedure or as directed by your health care provider. Remove the bandage (dressing) and gently wash the site with plain soap and water. Pat the area dry with a clean towel. Do not rub the site, because this may cause bleeding. °· Do not take baths, swim, or use a hot tub until your health care provider approves. °· Check your insertion site every day for redness, swelling, or drainage. °· Do not apply powder or lotion to the site. °· Do not flex or bend the affected arm for 24 hours or as directed by your health care provider. °· Do not push or pull heavy objects with the affected arm for 24 hours or as directed by your health care provider. °· Do not lift over 10 lb (4.5 kg) for 5 days after your procedure or as directed by your health care provider. °· Ask your health care provider when it is okay to: °¨ Return to work or school. °¨ Resume usual physical activities or sports. °¨ Resume sexual activity. °· Do not drive home if you are discharged the same day as the procedure. Have someone else drive you. °· You may drive 24 hours after the  procedure unless otherwise instructed by your health care provider. °· Do not operate machinery or power tools for 24 hours after the procedure. °· If your procedure was done as an outpatient procedure, which means that you went home the same day as your procedure, a responsible adult should be with you for the first 24 hours after you arrive home. °· Keep all follow-up visits as directed by your health care provider. This is important. °Contact a health care provider if: °· You have a fever. °· You have chills. °· You have increased bleeding from the radial site. Hold pressure on the site. °Get help right away if: °· You have unusual pain at the radial site. °· You have redness, warmth, or swelling at the radial site. °· You have drainage (other than a small amount of blood on the dressing) from the radial site. °· The radial site is bleeding, and the bleeding does not stop after 30 minutes of holding steady pressure on the site. °· Your arm or hand becomes pale, cool, tingly, or numb. °This information is not intended to replace advice given to you by your health care provider. Make sure you discuss any questions you have with your health care provider. °Document Released: 07/12/2010 Document Revised: 11/15/2015 Document Reviewed: 12/26/2013 °© 2017 Elsevier ° ° ° °Heart-Healthy Eating Plan °Introduction °Heart-healthy meal planning includes: °· Limiting unhealthy fats. °· Increasing healthy fats. °· Making other small dietary changes. °You may need to talk with   your doctor or a diet specialist (dietitian) to create an eating plan that is right for you. °What types of fat should I choose? °· Choose healthy fats. These include olive oil and canola oil, flaxseeds, walnuts, almonds, and seeds. °· Eat more omega-3 fats. These include salmon, mackerel, sardines, tuna, flaxseed oil, and ground flaxseeds. Try to eat fish at least twice each week. °· Limit saturated fats. °¨ Saturated fats are often found in animal  products, such as meats, butter, and cream. °¨ Plant sources of saturated fats include palm oil, palm kernel oil, and coconut oil. °· Avoid foods with partially hydrogenated oils in them. These include stick margarine, some tub margarines, cookies, crackers, and other baked goods. These contain trans fats. °What general guidelines do I need to follow? °· Check food labels carefully. Identify foods with trans fats or high amounts of saturated fat. °· Fill one half of your plate with vegetables and green salads. Eat 4-5 servings of vegetables per day. A serving of vegetables is: °¨ 1 cup of raw leafy vegetables. °¨ ½ cup of raw or cooked cut-up vegetables. °¨ ½ cup of vegetable juice. °· Fill one fourth of your plate with whole grains. Look for the word "whole" as the first word in the ingredient list. °· Fill one fourth of your plate with lean protein foods. °· Eat 4-5 servings of fruit per day. A serving of fruit is: °¨ One medium whole fruit. °¨ ¼ cup of dried fruit. °¨ ½ cup of fresh, frozen, or canned fruit. °¨ ½ cup of 100% fruit juice. °· Eat more foods that contain soluble fiber. These include apples, broccoli, carrots, beans, peas, and barley. Try to get 20-30 g of fiber per day. °· Eat more home-cooked food. Eat less restaurant, buffet, and fast food. °· Limit or avoid alcohol. °· Limit foods high in starch and sugar. °· Avoid fried foods. °· Avoid frying your food. Try baking, boiling, grilling, or broiling it instead. You can also reduce fat by: °¨ Removing the skin from poultry. °¨ Removing all visible fats from meats. °¨ Skimming the fat off of stews, soups, and gravies before serving them. °¨ Steaming vegetables in water or broth. °· Lose weight if you are overweight. °· Eat 4-5 servings of nuts, legumes, and seeds per week: °¨ One serving of dried beans or legumes equals ½ cup after being cooked. °¨ One serving of nuts equals 1½ ounces. °¨ One serving of seeds equals ½ ounce or one tablespoon. °· You  may need to keep track of how much salt or sodium you eat. This is especially true if you have high blood pressure. Talk with your doctor or dietitian to get more information. °What foods can I eat? °Grains  °Breads, including French, white, pita, wheat, raisin, rye, oatmeal, and Italian. Tortillas that are neither fried nor made with lard or trans fat. Low-fat rolls, including hotdog and hamburger buns and English muffins. Biscuits. Muffins. Waffles. Pancakes. Light popcorn. Whole-grain cereals. Flatbread. Melba toast. Pretzels. Breadsticks. Rusks. Low-fat snacks. Low-fat crackers, including oyster, saltine, matzo, graham, animal, and rye. Rice and pasta, including brown rice and pastas that are made with whole wheat. °Vegetables  °All vegetables. °Fruits  °All fruits, but limit coconut. °Meats and Other Protein Sources  °Lean, well-trimmed beef, veal, pork, and lamb. Chicken and turkey without skin. All fish and shellfish. Wild duck, rabbit, pheasant, and venison. Egg whites or low-cholesterol egg substitutes. Dried beans, peas, lentils, and tofu. Seeds and most nuts. °Dairy  °  Low-fat or nonfat cheeses, including ricotta, string, and mozzarella. Skim or 1% milk that is liquid, powdered, or evaporated. Buttermilk that is made with low-fat milk. Nonfat or low-fat yogurt. °Beverages  °Mineral water. Diet carbonated beverages. °Sweets and Desserts  °Sherbets and fruit ices. Honey, jam, marmalade, jelly, and syrups. Meringues and gelatins. Pure sugar candy, such as hard candy, jelly beans, gumdrops, mints, marshmallows, and small amounts of dark chocolate. Angel food cake. °Eat all sweets and desserts in moderation. °Fats and Oils  °Nonhydrogenated (trans-free) margarines. Vegetable oils, including soybean, sesame, sunflower, olive, peanut, safflower, corn, canola, and cottonseed. Salad dressings or mayonnaise made with a vegetable oil. Limit added fats and oils that you use for cooking, baking, salads, and as  spreads. °Other  °Cocoa powder. Coffee and tea. All seasonings and condiments. °The items listed above may not be a complete list of recommended foods or beverages. Contact your dietitian for more options.  °What foods are not recommended? °Grains  °Breads that are made with saturated or trans fats, oils, or whole milk. Croissants. Butter rolls. Cheese breads. Sweet rolls. Donuts. Buttered popcorn. Chow mein noodles. High-fat crackers, such as cheese or butter crackers. °Meats and Other Protein Sources  °Fatty meats, such as hotdogs, short ribs, sausage, spareribs, bacon, rib eye roast or steak, and mutton. High-fat deli meats, such as salami and bologna. Caviar. Domestic duck and goose. Organ meats, such as kidney, liver, sweetbreads, and heart. °Dairy  °Cream, sour cream, cream cheese, and creamed cottage cheese. Whole-milk cheeses, including blue (bleu), Monterey Jack, Brie, [place], American, Havarti, Swiss, cheddar, Camembert, and [place]. Whole or 2% milk that is liquid, evaporated, or condensed. Whole buttermilk. Cream sauce or high-fat cheese sauce. Yogurt that is made from whole milk. °Beverages  °Regular sodas and juice drinks with added sugar. °Sweets and Desserts  °Frosting. Pudding. Cookies. Cakes other than angel food cake. Candy that has milk chocolate or white chocolate, hydrogenated fat, butter, coconut, or unknown ingredients. Buttered syrups. Full-fat ice cream or ice cream drinks. °Fats and Oils  °Gravy that has suet, meat fat, or shortening. Cocoa butter, hydrogenated oils, palm oil, coconut oil, palm kernel oil. These can often be found in baked products, candy, fried foods, nondairy creamers, and whipped toppings. Solid fats and shortenings, including bacon fat, salt pork, lard, and butter. Nondairy cream substitutes, such as coffee creamers and sour cream substitutes. Salad dressings that are made of unknown oils, cheese, or sour cream. °The items listed above may not be a complete list of  foods and beverages to avoid. Contact your dietitian for more information.  °This information is not intended to replace advice given to you by your health care provider. Make sure you discuss any questions you have with your health care provider. °Document Released: 12/09/2011 Document Revised: 11/15/2015 Document Reviewed: 12/01/2013 °© 2017 Elsevier ° °

## 2016-07-22 NOTE — Discharge Summary (Signed)
Physician Discharge Summary  Kevin DrossLarry M Fricke [id number] DOB: 09/30/1945 DOA: 07/20/2016  PCP: No PCP Per Patient  Admit date: 07/20/2016 Discharge date: 07/22/2016   Recommendations for Outpatient Follow-Up:   1. Outpatient cardiology follow up   Discharge Diagnosis:   Principal Problem:   Chest pain with high risk for cardiac etiology Active Problems:   CAD (coronary artery disease), native coronary artery   Chest pain   Discharge disposition:  Home:  Discharge Condition: Improved.  Diet recommendation: Low sodium, heart healthy  Wound care: None.   History of Present Illness:   Kevin Randolph is a 71 y.o. male with medical history significant of CAD: [facility], then had STEMI in March 2010 with ISR of the proximal RCA stent, treated with thrombectomy and bare metal stent placement at that time.  Patient presents to ED with c/o chest pain.  Chest pain onset over the last few weeks, intermittent.  Is similar to prior episodes of angina and previous MI.  Radiates to L arm and elbow.  Mild associated SOB.  No nausea, syncope, or other symptoms.    Hospital Course by Problem:   Unstable angina -s/p cath: LHC: 07/21/16 1.  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 Distal RCA and its branches fill via left-to-right and bridging collaterals. 2. Moderate diffuse ostial/proximal LAD disease of up to 40-50% with significant calcification. Mild to moderate disease is also noted in the mid LCx. 3. Normal left ventricular filling pressure. 4. Moderately reduced left ventricular systolic function with inferior akinesis.  Recommendations: 1. Aggressive anti-anginal therapy. We will start isosorbide mononitrate 30 mg daily to be titrated up as tolerated. 2. Continue secondary prevention. 3. Obtain transthoracic echocardiogram to better evaluate LV  function:Left ventricle: The cavity size was normal. There was mild focal basal hypertrophy of the septum. Systolic function was mildly to moderately reduced. The estimated ejection fraction was in the range of 40% to 45%. Akinesis and scarring of the entireinferolateral and inferior myocardium; consistent with infarction in the distribution of the right coronary artery. Dyskinesis of the apicalinferior myocardium. Doppler parameters are consistent with abnormal left ventricular relaxation (grade 1 diastolic dysfunction). No evidence of thrombus. - Left atrium: The atrium was mildly dilated. - Right ventricle: Systolic function was mildly reduced.     Medical Consultants:    cards   Discharge Exam:   Vitals:   07/22/16 0953 07/22/16 1100  BP: 115/76   Pulse:  (!) 51  Resp:  15  Temp:  98.2 F (36.8 C)   Vitals:   07/22/16 0022 07/22/16 0509 07/22/16 0953 07/22/16 1100  BP: (!) 105/59 120/72 115/76   Pulse: (!) 49 (!) 50  (!) 51  Resp: 15 13  15   Temp: 98.5 F (36.9 C) 98 F (36.7 C)  98.2 F (36.8 C)  TempSrc: Oral Oral  Oral  SpO2: 93% 92%  96%  Weight:  66.3 kg (146 lb 1.6 oz)    Height:        Gen:  NAD- anxious to be discharged    The results of significant diagnostics from this hospitalization (including imaging, microbiology, ancillary and laboratory) are listed below for reference.     Procedures and Diagnostic Studies:   Dg Chest 2 View  Result Date: 07/20/2016 CLINICAL DATA:  Chest pain 2 weeks. EXAM: CHEST  2 VIEW COMPARISON:  10/15/2014 FINDINGS: Lungs are adequately  inflated without airspace consolidation or effusion. Small nodular density projected over the left base on the frontal film. Cardiomediastinal silhouette is within normal. There is calcified plaque over the aortic arch. There are mild degenerate changes of the spine. IMPRESSION: No acute cardiopulmonary disease. Small nodular density projected over the left base on the frontal film. Recommend  follow-up chest radiograph 3 months. If there is persist, would recommend follow-up noncontrast chest CT. Electronically Signed   By: Elberta Fortis M.D.   On: 07/20/2016 23:00     Labs:   Basic Metabolic Panel:  Recent Labs Lab 07/20/16 2144  NA 141  K 4.0  CL 112*  CO2 21*  GLUCOSE 88  BUN 11  CREATININE 0.66  CALCIUM 9.0   GFR Estimated Creatinine Clearance: 82.9 mL/min (by C-G formula based on SCr of 0.66 mg/dL). Liver Function Tests: No results for input(s): AST, ALT, ALKPHOS, BILITOT, PROT, ALBUMIN in the last 168 hours. No results for input(s): LIPASE, AMYLASE in the last 168 hours. No results for input(s): AMMONIA in the last 168 hours. Coagulation profile  Recent Labs Lab 07/21/16 1208  INR 1.08    CBC:  Recent Labs Lab 07/20/16 2144 07/22/16 0456  WBC 9.8 8.3  HGB 15.7 13.1  HCT 44.9 38.6*  MCV 95.3 96.3  PLT 212 169   Cardiac Enzymes:  Recent Labs Lab 07/20/16 2144 07/21/16 0029 07/21/16 0347 07/21/16 0639  TROPONINI <0.03 <0.03 <0.03 <0.03   BNP: Invalid input(s): POCBNP CBG: No results for input(s): GLUCAP in the last 168 hours. D-Dimer No results for input(s): DDIMER in the last 72 hours. Hgb A1c No results for input(s): HGBA1C in the last 72 hours. Lipid Profile No results for input(s): CHOL, HDL, LDLCALC, TRIG, CHOLHDL, LDLDIRECT in the last 72 hours. Thyroid function studies No results for input(s): TSH, T4TOTAL, T3FREE, THYROIDAB in the last 72 hours.  Invalid input(s): FREET3 Anemia work up No results for input(s): VITAMINB12, FOLATE, FERRITIN, TIBC, IRON, RETICCTPCT in the last 72 hours. Microbiology No results found for this or any previous visit (from the past 240 hour(s)).   Discharge Instructions:   Discharge Instructions    Diet - low sodium heart healthy    Complete by:  As directed    Increase activity slowly    Complete by:  As directed      Allergies as of 07/22/2016   No Known Allergies     Medication  List    TAKE these medications   aspirin EC 81 MG tablet Take 81 mg by mouth daily.   atorvastatin 40 MG tablet Commonly known as:  LIPITOR Take 40 mg by mouth daily.   carvedilol 6.25 MG tablet Commonly known as:  COREG Take 6.25 mg by mouth 2 (two) times daily with a meal.   isosorbide mononitrate 30 MG 24 hr tablet Commonly known as:  IMDUR Take 1 tablet (30 mg total) by mouth daily. Start taking on:  07/23/2016   lisinopril 10 MG tablet Commonly known as:  PRINIVIL,ZESTRIL Take 10 mg by mouth daily.      Follow-up Information    PCP 1 week Follow up.            Time coordinating discharge: 35 min  Signed:  Avik Leoni U Kerah Hardebeck   [facility] 07/22/2016, 12:02 PM

## 2016-07-22 NOTE — Progress Notes (Signed)
  Echocardiogram 2D Echocardiogram has been performed.  Arvil ChacoFoster, Jeremian Whitby 07/22/2016, 9:17 AM

## 2016-07-22 NOTE — Progress Notes (Signed)
Patient Name: Kevin DrossLarry M Randolph Date of Encounter: 07/22/2016  Principal Problem:   Chest pain with high risk for cardiac etiology Active Problems:   CAD (coronary artery disease), native coronary artery   Chest pain   Length of Stay: 1  SUBJECTIVE  The patient feels better today, chest pain free.  CURRENT MEDS . aspirin EC  81 mg Oral Daily  . atorvastatin  40 mg Oral q1800  . carvedilol  6.25 mg Oral BID WC  . enoxaparin (LOVENOX) injection  40 mg Subcutaneous Q24H  . hydrALAZINE  10 mg Intravenous Once  . isosorbide mononitrate  30 mg Oral Daily  . lisinopril  10 mg Oral Daily  . sodium chloride flush  3 mL Intravenous Q12H   OBJECTIVE  Vitals:   07/21/16 2147 07/22/16 0022 07/22/16 0509 07/22/16 0953  BP: 107/64 (!) 105/59 120/72 115/76  Pulse: (!) 52 (!) 49 (!) 50   Resp: 13 15 13    Temp:  98.5 F (36.9 C) 98 F (36.7 C)   TempSrc:  Oral Oral   SpO2: 95% 93% 92%   Weight:   146 lb 1.6 oz (66.3 kg)   Height:        Intake/Output Summary (Last 24 hours) at 07/22/16 1048 Last data filed at 07/22/16 0600  Gross per 24 hour  Intake                0 ml  Output              350 ml  Net             -350 ml   Filed Weights   07/21/16 0046 07/21/16 0500 07/22/16 0509  Weight: 146 lb 14.4 oz (66.6 kg) 145 lb 4.8 oz (65.9 kg) 146 lb 1.6 oz (66.3 kg)   PHYSICAL EXAM  General: Pleasant, NAD. Neuro: Alert and oriented X 3. Moves all extremities spontaneously. Psych: Normal affect. HEENT:  Normal  Neck: Supple without bruits or JVD. Lungs:  Resp regular and unlabored, CTA. Heart: RRR no s3, s4, or murmurs. Abdomen: Soft, non-tender, non-distended, BS + x 4.  Extremities: No clubbing, cyanosis or edema. DP/PT/Radials 2+ and equal bilaterally.  Accessory Clinical Findings  CBC  Recent Labs  07/20/16 2144 07/22/16 0456  WBC 9.8 8.3  HGB 15.7 13.1  HCT 44.9 38.6*  MCV 95.3 96.3  PLT 212 169   Basic Metabolic Panel  Recent Labs  07/20/16 2144  NA  141  K 4.0  CL 112*  CO2 21*  GLUCOSE 88  BUN 11  CREATININE 0.66  CALCIUM 9.0    Recent Labs  07/21/16 0029 07/21/16 0347 07/21/16 0639  TROPONINI <0.03 <0.03 <0.03   Radiology/Studies  Dg Chest 2 View  Result Date: 07/20/2016 CLINICAL DATA:  Chest pain 2 weeks. EXAM: CHEST  2 VIEW COMPARISON:  10/15/2014 FINDINGS: Lungs are adequately inflated without airspace consolidation or effusion. Small nodular density projected over the left base on the frontal film. Cardiomediastinal silhouette is within normal. There is calcified plaque over the aortic arch. There are mild degenerate changes of the spine. IMPRESSION: No acute cardiopulmonary disease. Small nodular density projected over the left base on the frontal film. Recommend follow-up chest radiograph 3 months. If there is persist, would recommend follow-up noncontrast chest CT. Electronically Signed   By: Elberta Fortisaniel  Boyle M.D.   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 Distal RCA and its branches fill via left-to-right and bridging collaterals. 2.  Moderate diffuse ostial/proximal LAD disease of up to 40-50% with significant calcification. Mild to moderate disease is also noted in the mid LCx. 3.  Normal left ventricular filling pressure. 4.  Moderately reduced left ventricular systolic function with inferior akinesis.  Recommendations: 1.  Aggressive anti-anginal therapy. We will start isosorbide mononitrate 30 mg daily to be titrated up as tolerated. 2.  Continue secondary prevention. 3.  Obtain transthoracic echocardiogram to better evaluate LV function. Continue with current evidence based heart failure regimen. 4.  Anticipate discharge tomorrow if pain improved with long-acting nitrates.    ASSESSMENT AND PLAN  71 year old male with h/o known CAD, s/p PCI/stents to RCA x 2 (2007,  2010), no compliant with meds, now experiencing unstable angina with progressively worsening DOE and chest pain radiating to his left arm x 2 weeks. Negative troponin x3, ECG shows significant negative T waves in the inferolateral leads. Chest pain improved with ASA , NTG and morphine. 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 Distal RCA and its branches fill via left-to-right and bridging collaterals. Moderate diffuse ostial/proximal LAD disease of up to 40-50% with significant calcification. Mild to moderate disease is also noted in the mid LCx.  Imdur was added to his regimen, he feels better, he can be discharged today. We will arrange for an outpatient follow up.  Signed, Tobias Alexander MD, [facility] 07/22/2016

## 2016-07-22 NOTE — Care Management Note (Signed)
Case Management Note  Patient Details  Name: Kevin DrossLarry M Randolph MRN: [id number] Date of Birth: 08/01/1945  Subjective/Objective:      Chest pain              Action/Plan: Discharge Planning: AVS reviewed:  NCM spoke to pt and states he does not have any drug coverage. [facility] [employer] Pt sees Dr Purnell ShoemakerKaram. Effie BerkshireExplained to pt that he will need to follow up with PCP in one week.   PCP  Lianne BushyKaram, Phillip MD  Expected Discharge Date:  07/22/16               Expected Discharge Plan:  Home/Self Care  In-House Referral:  NA  Discharge planning Services  CM Consult  Post Acute Care Choice:  NA Choice offered to:  NA  DME Arranged:  N/A DME Agency:  NA  HH Arranged:  NA HH Agency:  NA  Status of Service:  Completed, signed off  If discussed at Long Length of Stay Meetings, dates discussed:    Additional Comments:  Elliot CousinShavis, Lenorris Karger Ellen, RN 07/22/2016, 12:31 PM

## 2017-07-21 IMAGING — DX DG CHEST 2V
2 series · 2 of 2 positions shown · non-contrast
Comparison: 10/15/2014

CLINICAL DATA: Chest pain 2 weeks.

EXAM:
CHEST  2 VIEW

[w chest pa]
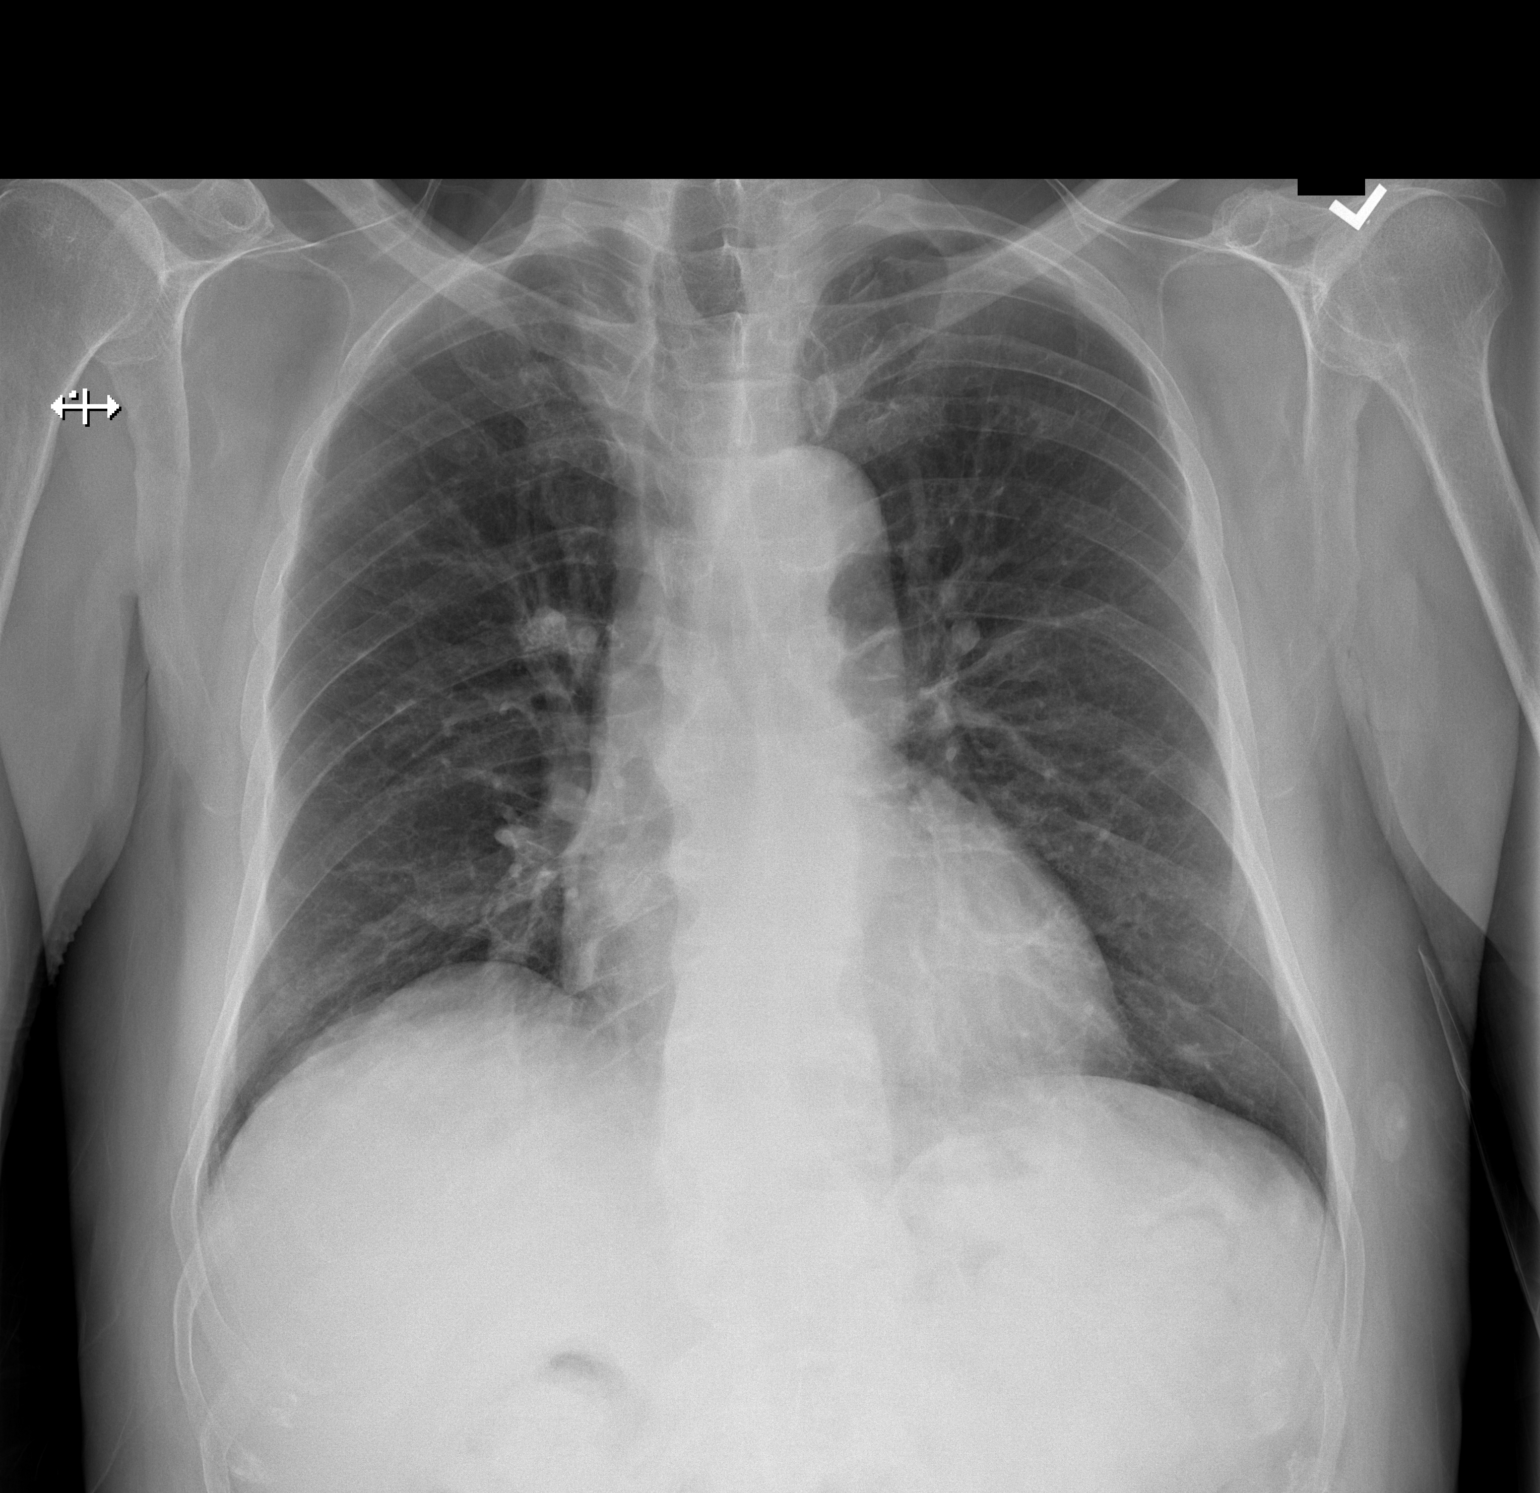

[w chest lat]
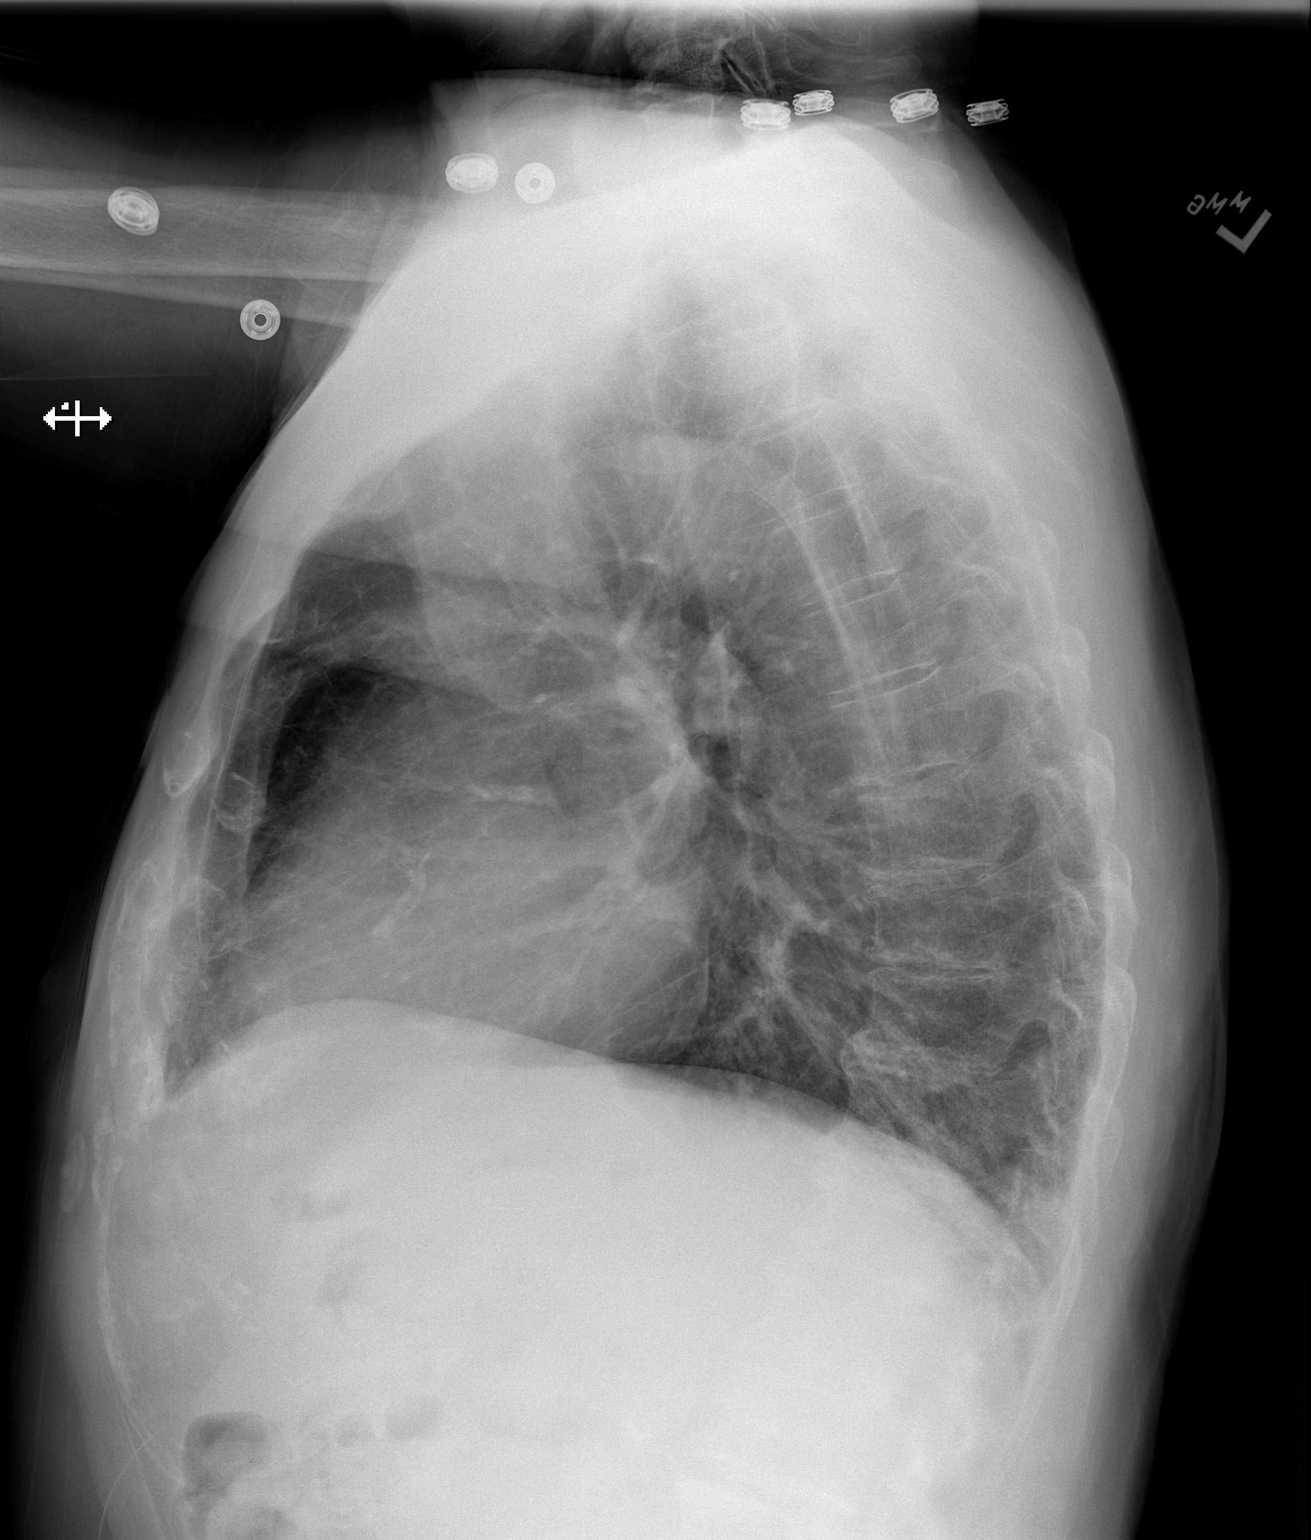

[2 of 2 positions shown; findings below may reference images not displayed]

FINDINGS: Lungs are adequately inflated without airspace consolidation or
effusion. Small nodular density projected over the left base on the
frontal film. Cardiomediastinal silhouette is within normal. There
is calcified plaque over the aortic arch. There are mild degenerate
changes of the spine.
IMPRESSION: No acute cardiopulmonary disease.

Small nodular density projected over the left base on the frontal
film. Recommend follow-up chest radiograph 3 months. If there is
persist, would recommend follow-up noncontrast chest CT.
